# Patient Record
Sex: Female | Born: 1955 | Race: White | Hispanic: No | Smoking: Never smoker
Health system: Southern US, Community
[De-identification: ages and names within clinical notes are randomized; demographics above are authoritative.]

## PROBLEM LIST (undated history)

## (undated) DIAGNOSIS — Z800 Family history of malignant neoplasm of digestive organs: Secondary | ICD-10-CM

## (undated) DIAGNOSIS — I10 Essential (primary) hypertension: Secondary | ICD-10-CM

## (undated) DIAGNOSIS — Z803 Family history of malignant neoplasm of breast: Secondary | ICD-10-CM

## (undated) DIAGNOSIS — E119 Type 2 diabetes mellitus without complications: Secondary | ICD-10-CM

## (undated) DIAGNOSIS — Z923 Personal history of irradiation: Secondary | ICD-10-CM

## (undated) DIAGNOSIS — Z808 Family history of malignant neoplasm of other organs or systems: Secondary | ICD-10-CM

## (undated) HISTORY — DX: Family history of malignant neoplasm of breast: Z80.3

## (undated) HISTORY — PX: FRACTURE SURGERY: SHX138

## (undated) HISTORY — DX: Family history of malignant neoplasm of other organs or systems: Z80.8

## (undated) HISTORY — PX: COLONOSCOPY: SHX174

## (undated) HISTORY — PX: CHOLECYSTECTOMY: SHX55

## (undated) HISTORY — PX: OTHER SURGICAL HISTORY: SHX169

## (undated) HISTORY — DX: Family history of malignant neoplasm of digestive organs: Z80.0

---

## 2013-06-09 ENCOUNTER — Ambulatory Visit: Payer: Self-pay | Admitting: Emergency Medicine

## 2014-12-11 ENCOUNTER — Ambulatory Visit
Admit: 2014-12-11 | Disposition: A | Discharge status: Home / Self Care | Payer: Self-pay | Attending: Family Medicine | Admitting: Family Medicine

## 2016-01-01 DIAGNOSIS — M1712 Unilateral primary osteoarthritis, left knee: Secondary | ICD-10-CM | POA: Insufficient documentation

## 2016-03-10 ENCOUNTER — Other Ambulatory Visit: Payer: Self-pay | Admitting: Family Medicine

## 2016-03-10 DIAGNOSIS — Z1231 Encounter for screening mammogram for malignant neoplasm of breast: Secondary | ICD-10-CM

## 2016-03-17 ENCOUNTER — Encounter: Payer: Self-pay | Admitting: Radiology

## 2016-03-17 ENCOUNTER — Ambulatory Visit
Admission: RE | Admit: 2016-03-17 | Discharge: 2016-03-17 | Disposition: A | Discharge status: Home / Self Care | Payer: BC Managed Care – PPO | Source: Ambulatory Visit | Attending: Family Medicine | Admitting: Family Medicine

## 2016-03-17 DIAGNOSIS — Z1231 Encounter for screening mammogram for malignant neoplasm of breast: Secondary | ICD-10-CM | POA: Diagnosis present

## 2018-04-28 ENCOUNTER — Encounter (INDEPENDENT_AMBULATORY_CARE_PROVIDER_SITE_OTHER): Payer: Self-pay

## 2018-04-28 ENCOUNTER — Other Ambulatory Visit: Payer: Self-pay | Admitting: Obstetrics and Gynecology

## 2018-04-28 ENCOUNTER — Ambulatory Visit
Admission: RE | Admit: 2018-04-28 | Discharge: 2018-04-28 | Disposition: A | Discharge status: Home / Self Care | Payer: BC Managed Care – PPO | Source: Ambulatory Visit | Attending: Obstetrics and Gynecology | Admitting: Obstetrics and Gynecology

## 2018-04-28 DIAGNOSIS — Z1231 Encounter for screening mammogram for malignant neoplasm of breast: Secondary | ICD-10-CM | POA: Insufficient documentation

## 2018-05-03 ENCOUNTER — Other Ambulatory Visit: Payer: Self-pay | Admitting: Obstetrics and Gynecology

## 2018-05-03 DIAGNOSIS — N6489 Other specified disorders of breast: Secondary | ICD-10-CM

## 2018-05-03 DIAGNOSIS — R928 Other abnormal and inconclusive findings on diagnostic imaging of breast: Secondary | ICD-10-CM

## 2018-05-11 ENCOUNTER — Ambulatory Visit: Payer: BC Managed Care – PPO

## 2018-05-11 ENCOUNTER — Other Ambulatory Visit: Payer: BC Managed Care – PPO

## 2018-05-14 ENCOUNTER — Ambulatory Visit
Admission: RE | Admit: 2018-05-14 | Discharge: 2018-05-14 | Disposition: A | Discharge status: Home / Self Care | Payer: BC Managed Care – PPO | Source: Ambulatory Visit | Attending: Obstetrics and Gynecology | Admitting: Obstetrics and Gynecology

## 2018-05-14 DIAGNOSIS — R928 Other abnormal and inconclusive findings on diagnostic imaging of breast: Secondary | ICD-10-CM

## 2018-05-14 DIAGNOSIS — N6489 Other specified disorders of breast: Secondary | ICD-10-CM

## 2018-08-04 ENCOUNTER — Other Ambulatory Visit: Payer: Self-pay | Admitting: Obstetrics and Gynecology

## 2018-08-04 DIAGNOSIS — N6489 Other specified disorders of breast: Secondary | ICD-10-CM

## 2018-08-19 ENCOUNTER — Ambulatory Visit
Admission: RE | Admit: 2018-08-19 | Discharge: 2018-08-19 | Disposition: A | Discharge status: Home / Self Care | Payer: BC Managed Care – PPO | Source: Ambulatory Visit | Attending: Obstetrics and Gynecology | Admitting: Obstetrics and Gynecology

## 2018-08-19 DIAGNOSIS — N6489 Other specified disorders of breast: Secondary | ICD-10-CM

## 2018-08-24 ENCOUNTER — Other Ambulatory Visit: Payer: Self-pay | Admitting: Obstetrics and Gynecology

## 2018-08-24 DIAGNOSIS — N6489 Other specified disorders of breast: Secondary | ICD-10-CM

## 2019-06-23 ENCOUNTER — Ambulatory Visit
Admission: RE | Admit: 2019-06-23 | Discharge: 2019-06-23 | Disposition: A | Discharge status: Home / Self Care | Payer: BC Managed Care – PPO | Source: Ambulatory Visit | Attending: Obstetrics and Gynecology | Admitting: Obstetrics and Gynecology

## 2019-06-23 DIAGNOSIS — N6489 Other specified disorders of breast: Secondary | ICD-10-CM

## 2019-12-20 ENCOUNTER — Other Ambulatory Visit: Payer: Self-pay | Admitting: Surgery

## 2019-12-20 DIAGNOSIS — M1712 Unilateral primary osteoarthritis, left knee: Secondary | ICD-10-CM

## 2019-12-20 DIAGNOSIS — M23204 Derangement of unspecified medial meniscus due to old tear or injury, left knee: Secondary | ICD-10-CM

## 2019-12-26 ENCOUNTER — Other Ambulatory Visit: Payer: Self-pay

## 2019-12-26 ENCOUNTER — Ambulatory Visit
Admission: RE | Admit: 2019-12-26 | Discharge: 2019-12-26 | Disposition: A | Discharge status: Home / Self Care | Payer: BC Managed Care – PPO | Source: Ambulatory Visit | Attending: Surgery | Admitting: Surgery

## 2019-12-26 DIAGNOSIS — M1712 Unilateral primary osteoarthritis, left knee: Secondary | ICD-10-CM | POA: Insufficient documentation

## 2019-12-26 DIAGNOSIS — M23204 Derangement of unspecified medial meniscus due to old tear or injury, left knee: Secondary | ICD-10-CM | POA: Diagnosis not present

## 2020-01-06 ENCOUNTER — Other Ambulatory Visit: Payer: Self-pay | Admitting: Surgery

## 2020-01-13 ENCOUNTER — Other Ambulatory Visit: Payer: Self-pay

## 2020-01-13 ENCOUNTER — Encounter
Admission: RE | Admit: 2020-01-13 | Discharge: 2020-01-13 | Disposition: A | Discharge status: Home / Self Care | Payer: BC Managed Care – PPO | Source: Ambulatory Visit | Attending: Surgery | Admitting: Surgery

## 2020-01-13 HISTORY — DX: Essential (primary) hypertension: I10

## 2020-01-13 NOTE — Patient Instructions (Signed)
Your pre-op EKG visit is: Tuesday 01/17/2020 @ 1:00 pm.  [facility] and follow directions to Pre-Admit Testing department. Your pre-op COVID test is: Tuesday 01/24/2020.  Drive up in front of [place] any time 8:00 am to 1:00 pm.  Your procedure is scheduled on: Thursday 01/26/2020 Report [facility], take elevator to 2nd floor and check in with Surgery Information.) To find out your arrival time, call [phone] 1:00-3:00 PM on Wednesday 01/25/2020  Remember: Instructions that are not followed completely may result in serious medical risk, up to and including death, or upon the discretion of your surgeon and anesthesiologist your surgery may need to be rescheduled.   __x__ 1. Do not eat food (including mints, candies, chewing gum) after midnight the night before your procedure. You may drink clear liquids up to 2 hours before you are scheduled to arrive at the hospital for your procedure.  Do not drink anything within 2 hours of your scheduled arrival to the hospital.  Approved clear liquids:  --Water or Apple juice without pulp  --Clear carbohydrate beverage such as Gatorade or Powerade  --Black Coffee or Clear Tea (No milk, no creamers, do not add anything to the coffee or tea)   __x__ 2. Finish your Ensure drink (enclosed) 2 hours before your arrival time on the day of surgery.  __x__ 3. No alcohol or smoking for 24 hours before or after surgery.  __x__ 4. Notify your doctor if there is any change in your medical condition (cold, fever, infections).  __x__ 5. On the morning of surgery brush your teeth with toothpaste and water.  You may rinse your mouth with mouthwash if you wish.  Do not swallow any toothpaste or mouthwash.  Please read over the attached fact sheets:   [facility] Preparing for Surgery, MRSA Information, Guide to Total Joint Replacement  __x__ Use CHG Soap as directed on instruction sheet.   Do not wear jewelry, make-up,  hairpins, clips or nail polish on the day of surgery.  Do not wear lotions, powders, deodorant, or perfumes.   Do not shave below the face/neck 48 hours prior to surgery.   Do not bring valuables to the hospital.  [facility] is not responsible for any belongings or valuables.   Contact lenses, dentures may not be worn into surgery.  It is helpful to bring a case for these items that can be labeled during your hospital stay. For patients admitted to the hospital, discharge date/ time is determined by your treatment team.   __x__ Take these medicines on the morning of surgery with a SMALL SIP OF WATER:  1. NONE  _N/A_ Follow recommendations from Cardiologist, Pulmonologist or PCP regarding stopping Coumadin, Plavix, Eliquis, Effient, Pradaxa, and Pletal.  __x__ 7 DAYS BEFORE SURGERY: Stop Anti-inflammatories such as Etodolac (Lodine), Advil, Ibuprofen, Motrin, Aleve, Naproxen, Naprosyn, BC/Goodies powders or aspirin products. You may take Tylenol if needed.   __x__ 7 DAYS BEFORE SURGERY: Do not take any over the counter supplements (Calcium multi-supplement) until after surgery.

## 2020-01-17 ENCOUNTER — Other Ambulatory Visit: Payer: Self-pay

## 2020-01-17 ENCOUNTER — Encounter
Admission: RE | Admit: 2020-01-17 | Discharge: 2020-01-17 | Disposition: A | Discharge status: Home / Self Care | Payer: BC Managed Care – PPO | Source: Ambulatory Visit | Attending: Surgery | Admitting: Surgery

## 2020-01-17 DIAGNOSIS — Z01818 Encounter for other preprocedural examination: Secondary | ICD-10-CM | POA: Insufficient documentation

## 2020-01-17 LAB — CBC WITH DIFFERENTIAL/PLATELET
Abs Immature Granulocytes: 0.03 10*3/uL (ref 0.00–0.07)
Basophils Absolute: 0.1 10*3/uL (ref 0.0–0.1)
Basophils Relative: 1 %
Eosinophils Absolute: 0.2 10*3/uL (ref 0.0–0.5)
Eosinophils Relative: 3 %
HCT: 40.8 % (ref 36.0–46.0)
Hemoglobin: 13.8 g/dL (ref 12.0–15.0)
Immature Granulocytes: 0 %
Lymphocytes Relative: 24 %
Lymphs Abs: 2.1 10*3/uL (ref 0.7–4.0)
MCH: 30.3 pg (ref 26.0–34.0)
MCHC: 33.8 g/dL (ref 30.0–36.0)
MCV: 89.5 fL (ref 80.0–100.0)
Monocytes Absolute: 0.7 10*3/uL (ref 0.1–1.0)
Monocytes Relative: 9 %
Neutro Abs: 5.4 10*3/uL (ref 1.7–7.7)
Neutrophils Relative %: 63 %
Platelets: 220 10*3/uL (ref 150–400)
RBC: 4.56 MIL/uL (ref 3.87–5.11)
RDW: 13.2 % (ref 11.5–15.5)
WBC: 8.5 10*3/uL (ref 4.0–10.5)
nRBC: 0 % (ref 0.0–0.2)

## 2020-01-17 LAB — COMPREHENSIVE METABOLIC PANEL
ALT: 28 U/L (ref 0–44)
AST: 26 U/L (ref 15–41)
Albumin: 3.9 g/dL (ref 3.5–5.0)
Alkaline Phosphatase: 68 U/L (ref 38–126)
Anion gap: 7 (ref 5–15)
BUN: 18 mg/dL (ref 8–23)
CO2: 27 mmol/L (ref 22–32)
Calcium: 9.5 mg/dL (ref 8.9–10.3)
Chloride: 107 mmol/L (ref 98–111)
Creatinine, Ser: 0.63 mg/dL (ref 0.44–1.00)
GFR calc Af Amer: >60 mL/min (ref >60)
GFR calc non Af Amer: >60 mL/min (ref >60)
Glucose, Bld: 95 mg/dL (ref 70–99)
Potassium: 4.3 mmol/L (ref 3.5–5.1)
Sodium: 141 mmol/L (ref 135–145)
Total Bilirubin: 1.1 mg/dL (ref 0.3–1.2)
Total Protein: 7.4 g/dL (ref 6.5–8.1)

## 2020-01-17 LAB — TYPE AND SCREEN
ABO/RH(D): O POS
Antibody Screen: NEGATIVE

## 2020-01-17 LAB — URINALYSIS, ROUTINE W REFLEX MICROSCOPIC
Glucose, UA: NEGATIVE mg/dL
Hgb urine dipstick: NEGATIVE
Ketones, ur: NEGATIVE mg/dL
Leukocytes,Ua: NEGATIVE
Nitrite: NEGATIVE
Protein, ur: NEGATIVE mg/dL
Specific Gravity, Urine: 1.025 (ref 1.005–1.030)
pH: 6 (ref 5.0–8.0)

## 2020-01-17 LAB — SURGICAL PCR SCREEN
MRSA, PCR: NEGATIVE
Staphylococcus aureus: POSITIVE — AB

## 2020-01-18 NOTE — Pre-Procedure Instructions (Signed)
Pre-Admit Testing Provider Notification Note  Provider Notified: Dr. Poggi  Notification Mode: Fax  Reason: Abnormal lab result.  Response: Fax confirmation received.   Additional Information: Placed on chart. Noted on Pre-Admit Worksheet.  Signed: Danilynn Jemison, RN  

## 2020-01-24 ENCOUNTER — Other Ambulatory Visit: Payer: Self-pay

## 2020-01-24 ENCOUNTER — Other Ambulatory Visit
Admission: RE | Admit: 2020-01-24 | Discharge: 2020-01-24 | Disposition: A | Discharge status: Home / Self Care | Payer: BC Managed Care – PPO | Source: Ambulatory Visit | Attending: Surgery | Admitting: Surgery

## 2020-01-24 DIAGNOSIS — Z20822 Contact with and (suspected) exposure to covid-19: Secondary | ICD-10-CM | POA: Insufficient documentation

## 2020-01-24 DIAGNOSIS — Z01812 Encounter for preprocedural laboratory examination: Secondary | ICD-10-CM | POA: Insufficient documentation

## 2020-01-25 LAB — SARS CORONAVIRUS 2 (TAT 6-24 HRS): SARS Coronavirus 2: NEGATIVE

## 2020-01-26 ENCOUNTER — Inpatient Hospital Stay: Payer: BC Managed Care – PPO | Admitting: Certified Registered"

## 2020-01-26 ENCOUNTER — Inpatient Hospital Stay
Admission: AD | Admit: 2020-01-26 | Discharge: 2020-01-29 | DRG: 470 | Disposition: A | Discharge status: Home Health | Payer: BC Managed Care – PPO | Attending: Surgery | Admitting: Surgery

## 2020-01-26 ENCOUNTER — Other Ambulatory Visit: Payer: Self-pay

## 2020-01-26 ENCOUNTER — Encounter
Admission: AD | Disposition: A | Discharge status: Home / Self Care | Payer: Self-pay | Source: Home / Self Care | Attending: Surgery

## 2020-01-26 ENCOUNTER — Inpatient Hospital Stay: Payer: BC Managed Care – PPO

## 2020-01-26 ENCOUNTER — Encounter: Payer: Self-pay | Admitting: Surgery

## 2020-01-26 DIAGNOSIS — Z801 Family history of malignant neoplasm of trachea, bronchus and lung: Secondary | ICD-10-CM | POA: Diagnosis not present

## 2020-01-26 DIAGNOSIS — Z79899 Other long term (current) drug therapy: Secondary | ICD-10-CM

## 2020-01-26 DIAGNOSIS — M7122 Synovial cyst of popliteal space [Baker], left knee: Secondary | ICD-10-CM | POA: Diagnosis present

## 2020-01-26 DIAGNOSIS — M25362 Other instability, left knee: Secondary | ICD-10-CM | POA: Diagnosis present

## 2020-01-26 DIAGNOSIS — K219 Gastro-esophageal reflux disease without esophagitis: Secondary | ICD-10-CM | POA: Diagnosis present

## 2020-01-26 DIAGNOSIS — Z6841 Body Mass Index (BMI) 40.0 and over, adult: Secondary | ICD-10-CM | POA: Diagnosis not present

## 2020-01-26 DIAGNOSIS — Z96652 Presence of left artificial knee joint: Secondary | ICD-10-CM

## 2020-01-26 DIAGNOSIS — Z9049 Acquired absence of other specified parts of digestive tract: Secondary | ICD-10-CM | POA: Diagnosis not present

## 2020-01-26 DIAGNOSIS — I10 Essential (primary) hypertension: Secondary | ICD-10-CM | POA: Diagnosis present

## 2020-01-26 DIAGNOSIS — Z20822 Contact with and (suspected) exposure to covid-19: Secondary | ICD-10-CM | POA: Diagnosis present

## 2020-01-26 DIAGNOSIS — G4730 Sleep apnea, unspecified: Secondary | ICD-10-CM | POA: Diagnosis present

## 2020-01-26 DIAGNOSIS — Z8249 Family history of ischemic heart disease and other diseases of the circulatory system: Secondary | ICD-10-CM

## 2020-01-26 DIAGNOSIS — M1712 Unilateral primary osteoarthritis, left knee: Principal | ICD-10-CM | POA: Diagnosis present

## 2020-01-26 DIAGNOSIS — Z8349 Family history of other endocrine, nutritional and metabolic diseases: Secondary | ICD-10-CM | POA: Diagnosis not present

## 2020-01-26 HISTORY — PX: TOTAL KNEE ARTHROPLASTY: SHX125

## 2020-01-26 LAB — ABO/RH: ABO/RH(D): O POS

## 2020-01-26 SURGERY — ARTHROPLASTY, KNEE, TOTAL
Anesthesia: Spinal | Site: Knee | Laterality: Left

## 2020-01-26 MED ORDER — SODIUM CHLORIDE 0.9 % IV SOLN
INTRAVENOUS | Status: DC | PRN
  Administered 2020-01-26: 25 ug/min via INTRAVENOUS

## 2020-01-26 MED ORDER — ONDANSETRON HCL 4 MG/2ML IJ SOLN
INTRAMUSCULAR | Status: AC
  Filled 2020-01-26: qty 2

## 2020-01-26 MED ORDER — BISACODYL 10 MG RE SUPP: 10.0000 mg | Freq: Every day | RECTAL | Status: DC | PRN

## 2020-01-26 MED ORDER — CHLORHEXIDINE GLUCONATE 0.12 % MT SOLN
OROMUCOSAL | Status: AC
  Filled 2020-01-26: qty 15

## 2020-01-26 MED ORDER — KETOROLAC TROMETHAMINE 30 MG/ML IJ SOLN: 30.0000 mg | Freq: Once | INTRAMUSCULAR | Status: AC

## 2020-01-26 MED ORDER — GLYCOPYRROLATE 0.2 MG/ML IJ SOLN
INTRAMUSCULAR | Status: DC | PRN
  Administered 2020-01-26: .2 mg via INTRAVENOUS

## 2020-01-26 MED ORDER — LACTATED RINGERS IV SOLN
INTRAVENOUS | Status: DC
  Administered 2020-01-26: 75 mL/h via INTRAVENOUS

## 2020-01-26 MED ORDER — OXYCODONE HCL 5 MG PO TABS
5.0000 mg | ORAL_TABLET | ORAL | Status: DC | PRN
  Administered 2020-01-26 – 2020-01-27 (×5): 5 mg via ORAL
  Administered 2020-01-28 – 2020-01-29 (×7): 10 mg via ORAL
  Filled 2020-01-26: qty 1
  Filled 2020-01-26 (×3): qty 2
  Filled 2020-01-26: qty 1
  Filled 2020-01-26 (×6): qty 2
  Filled 2020-01-26 (×2): qty 1
  Filled 2020-01-26: qty 2

## 2020-01-26 MED ORDER — ACETAMINOPHEN 500 MG PO TABS
1000.0000 mg | ORAL_TABLET | Freq: Four times a day (QID) | ORAL | Status: AC
  Administered 2020-01-26 – 2020-01-27 (×3): 1000 mg via ORAL
  Filled 2020-01-26 (×3): qty 2

## 2020-01-26 MED ORDER — FAMOTIDINE 20 MG PO TABS
ORAL_TABLET | ORAL | Status: AC
  Administered 2020-01-26: 20 mg via ORAL
  Filled 2020-01-26: qty 1

## 2020-01-26 MED ORDER — ONDANSETRON HCL 4 MG/2ML IJ SOLN: 4.0000 mg | Freq: Four times a day (QID) | INTRAMUSCULAR | Status: DC | PRN

## 2020-01-26 MED ORDER — FENTANYL CITRATE (PF) 100 MCG/2ML IJ SOLN
INTRAMUSCULAR | Status: AC
  Administered 2020-01-26: 50 ug via INTRAVENOUS
  Filled 2020-01-26: qty 2

## 2020-01-26 MED ORDER — FAMOTIDINE 20 MG PO TABS: 20.0000 mg | ORAL_TABLET | Freq: Once | ORAL | Status: AC

## 2020-01-26 MED ORDER — LOSARTAN POTASSIUM 25 MG PO TABS
25.0000 mg | ORAL_TABLET | Freq: Every day | ORAL | Status: DC
  Administered 2020-01-26 – 2020-01-29 (×4): 25 mg via ORAL
  Filled 2020-01-26 (×4): qty 1

## 2020-01-26 MED ORDER — MAGNESIUM HYDROXIDE 400 MG/5ML PO SUSP
30.0000 mL | Freq: Every day | ORAL | Status: DC | PRN
  Administered 2020-01-27: 30 mL via ORAL
  Filled 2020-01-26: qty 30

## 2020-01-26 MED ORDER — SODIUM CHLORIDE 0.9 % IV SOLN: INTRAVENOUS | Status: DC

## 2020-01-26 MED ORDER — METOCLOPRAMIDE HCL 5 MG/ML IJ SOLN: 5.0000 mg | Freq: Three times a day (TID) | INTRAMUSCULAR | Status: DC | PRN

## 2020-01-26 MED ORDER — MIDAZOLAM HCL 2 MG/2ML IJ SOLN
INTRAMUSCULAR | Status: AC
  Filled 2020-01-26: qty 2

## 2020-01-26 MED ORDER — KETAMINE HCL 10 MG/ML IJ SOLN
INTRAMUSCULAR | Status: DC | PRN
  Administered 2020-01-26 (×5): 10 mg via INTRAVENOUS

## 2020-01-26 MED ORDER — ONDANSETRON HCL 4 MG/2ML IJ SOLN
INTRAMUSCULAR | Status: DC | PRN
  Administered 2020-01-26: 4 mg via INTRAVENOUS

## 2020-01-26 MED ORDER — KETOROLAC TROMETHAMINE 30 MG/ML IJ SOLN
INTRAMUSCULAR | Status: AC
  Administered 2020-01-26: 30 mg via INTRAVENOUS
  Filled 2020-01-26: qty 1

## 2020-01-26 MED ORDER — BUPIVACAINE HCL (PF) 0.5 % IJ SOLN
INTRAMUSCULAR | Status: DC | PRN
  Administered 2020-01-26: 2.6 mL

## 2020-01-26 MED ORDER — TRANEXAMIC ACID 1000 MG/10ML IV SOLN
INTRAVENOUS | Status: AC
  Filled 2020-01-26: qty 10

## 2020-01-26 MED ORDER — HYDROMORPHONE HCL 1 MG/ML IJ SOLN
INTRAMUSCULAR | Status: AC
  Filled 2020-01-26: qty 1

## 2020-01-26 MED ORDER — OXYCODONE HCL 5 MG PO TABS
ORAL_TABLET | ORAL | Status: AC
  Administered 2020-01-26: 5 mg via ORAL
  Filled 2020-01-26: qty 1

## 2020-01-26 MED ORDER — CHLORHEXIDINE GLUCONATE 0.12 % MT SOLN
15.0000 mL | Freq: Once | OROMUCOSAL | Status: AC
  Administered 2020-01-26: 15 mL via OROMUCOSAL

## 2020-01-26 MED ORDER — SPIRONOLACTONE 25 MG PO TABS
25.0000 mg | ORAL_TABLET | Freq: Every day | ORAL | Status: DC
  Administered 2020-01-26 – 2020-01-29 (×4): 25 mg via ORAL
  Filled 2020-01-26 (×4): qty 1

## 2020-01-26 MED ORDER — BUPIVACAINE-EPINEPHRINE (PF) 0.5% -1:200000 IJ SOLN
INTRAMUSCULAR | Status: DC | PRN
  Administered 2020-01-26: 30 mL

## 2020-01-26 MED ORDER — BUPIVACAINE-EPINEPHRINE (PF) 0.5% -1:200000 IJ SOLN
INTRAMUSCULAR | Status: AC
  Filled 2020-01-26: qty 90

## 2020-01-26 MED ORDER — FENTANYL CITRATE (PF) 100 MCG/2ML IJ SOLN
INTRAMUSCULAR | Status: DC | PRN
  Administered 2020-01-26: 25 ug via INTRAVENOUS
  Administered 2020-01-26: 50 ug via INTRAVENOUS
  Administered 2020-01-26: 25 ug via INTRAVENOUS

## 2020-01-26 MED ORDER — DEXMEDETOMIDINE HCL IN NACL 80 MCG/20ML IV SOLN
INTRAVENOUS | Status: AC
  Filled 2020-01-26: qty 20

## 2020-01-26 MED ORDER — DOCUSATE SODIUM 100 MG PO CAPS
100.0000 mg | ORAL_CAPSULE | Freq: Two times a day (BID) | ORAL | Status: DC
  Administered 2020-01-26 – 2020-01-29 (×6): 100 mg via ORAL
  Filled 2020-01-26 (×6): qty 1

## 2020-01-26 MED ORDER — SODIUM CHLORIDE FLUSH 0.9 % IV SOLN
INTRAVENOUS | Status: AC
  Filled 2020-01-26: qty 20

## 2020-01-26 MED ORDER — HYDROMORPHONE HCL 1 MG/ML IJ SOLN
0.2500 mg | INTRAMUSCULAR | Status: DC | PRN
  Administered 2020-01-28 (×3): 0.5 mg via INTRAVENOUS
  Filled 2020-01-26 (×3): qty 1

## 2020-01-26 MED ORDER — ORAL CARE MOUTH RINSE: 15.0000 mL | Freq: Once | OROMUCOSAL | Status: AC

## 2020-01-26 MED ORDER — DIPHENHYDRAMINE HCL 12.5 MG/5ML PO ELIX: 12.5000 mg | ORAL_SOLUTION | ORAL | Status: DC | PRN

## 2020-01-26 MED ORDER — KETOROLAC TROMETHAMINE 15 MG/ML IJ SOLN
15.0000 mg | Freq: Four times a day (QID) | INTRAMUSCULAR | Status: AC
  Administered 2020-01-26 – 2020-01-27 (×4): 15 mg via INTRAVENOUS
  Filled 2020-01-26: qty 1

## 2020-01-26 MED ORDER — KETAMINE HCL 50 MG/ML IJ SOLN
INTRAMUSCULAR | Status: AC
  Filled 2020-01-26: qty 10

## 2020-01-26 MED ORDER — OXYCODONE HCL 5 MG PO TABS
ORAL_TABLET | ORAL | Status: AC
  Filled 2020-01-26: qty 1

## 2020-01-26 MED ORDER — PROMETHAZINE HCL 25 MG/ML IJ SOLN: 6.2500 mg | INTRAMUSCULAR | Status: DC | PRN

## 2020-01-26 MED ORDER — ENOXAPARIN SODIUM 40 MG/0.4ML ~~LOC~~ SOLN
40.0000 mg | Freq: Two times a day (BID) | SUBCUTANEOUS | Status: DC
  Administered 2020-01-27 – 2020-01-29 (×5): 40 mg via SUBCUTANEOUS
  Filled 2020-01-26 (×5): qty 0.4

## 2020-01-26 MED ORDER — DEXTROSE 5 % IV SOLN
3.0000 g | Freq: Four times a day (QID) | INTRAVENOUS | Status: AC
  Administered 2020-01-26 – 2020-01-27 (×3): 3 g via INTRAVENOUS
  Filled 2020-01-26 (×3): qty 3

## 2020-01-26 MED ORDER — ACETAMINOPHEN 325 MG PO TABS
325.0000 mg | ORAL_TABLET | Freq: Four times a day (QID) | ORAL | Status: DC | PRN
  Administered 2020-01-28 (×2): 650 mg via ORAL
  Filled 2020-01-26 (×2): qty 2

## 2020-01-26 MED ORDER — ONDANSETRON HCL 4 MG PO TABS: 4.0000 mg | ORAL_TABLET | Freq: Four times a day (QID) | ORAL | Status: DC | PRN

## 2020-01-26 MED ORDER — BUPIVACAINE LIPOSOME 1.3 % IJ SUSP
INTRAMUSCULAR | Status: AC
  Filled 2020-01-26: qty 20

## 2020-01-26 MED ORDER — PROPOFOL 500 MG/50ML IV EMUL
INTRAVENOUS | Status: AC
  Filled 2020-01-26: qty 50

## 2020-01-26 MED ORDER — TRANEXAMIC ACID 1000 MG/10ML IV SOLN
INTRAVENOUS | Status: DC | PRN
  Administered 2020-01-26: 1000 mg via TOPICAL

## 2020-01-26 MED ORDER — TRAMADOL HCL 50 MG PO TABS
50.0000 mg | ORAL_TABLET | Freq: Four times a day (QID) | ORAL | Status: DC
  Administered 2020-01-26 – 2020-01-29 (×12): 50 mg via ORAL
  Filled 2020-01-26 (×11): qty 1

## 2020-01-26 MED ORDER — MIDAZOLAM HCL 5 MG/5ML IJ SOLN
INTRAMUSCULAR | Status: DC | PRN
  Administered 2020-01-26 (×4): 1 mg via INTRAVENOUS

## 2020-01-26 MED ORDER — ADULT MULTIVITAMIN W/MINERALS CH
1.0000 | ORAL_TABLET | Freq: Two times a day (BID) | ORAL | Status: DC
  Administered 2020-01-26 – 2020-01-29 (×6): 1 via ORAL
  Filled 2020-01-26 (×6): qty 1

## 2020-01-26 MED ORDER — FENTANYL CITRATE (PF) 100 MCG/2ML IJ SOLN
INTRAMUSCULAR | Status: AC
  Filled 2020-01-26: qty 2

## 2020-01-26 MED ORDER — DEXMEDETOMIDINE HCL 200 MCG/2ML IV SOLN
INTRAVENOUS | Status: DC | PRN
  Administered 2020-01-26 (×3): 4 ug via INTRAVENOUS

## 2020-01-26 MED ORDER — SODIUM CHLORIDE 0.9 % IV SOLN
INTRAVENOUS | Status: DC | PRN
  Administered 2020-01-26: 60 mL

## 2020-01-26 MED ORDER — DEXTROSE 5 % IV SOLN
3.0000 g | INTRAVENOUS | Status: AC
  Administered 2020-01-26: 3 g via INTRAVENOUS
  Filled 2020-01-26: qty 3

## 2020-01-26 MED ORDER — KETOROLAC TROMETHAMINE 15 MG/ML IJ SOLN
INTRAMUSCULAR | Status: AC
  Filled 2020-01-26: qty 1

## 2020-01-26 MED ORDER — HYDROMORPHONE HCL 1 MG/ML IJ SOLN
INTRAMUSCULAR | Status: DC | PRN
  Administered 2020-01-26 (×4): .25 mg via INTRAVENOUS

## 2020-01-26 MED ORDER — LIDOCAINE HCL (PF) 2 % IJ SOLN
INTRAMUSCULAR | Status: AC
  Filled 2020-01-26: qty 5

## 2020-01-26 MED ORDER — FENTANYL CITRATE (PF) 100 MCG/2ML IJ SOLN
25.0000 ug | INTRAMUSCULAR | Status: DC | PRN
  Administered 2020-01-26: 50 ug via INTRAVENOUS

## 2020-01-26 MED ORDER — METOCLOPRAMIDE HCL 10 MG PO TABS: 5.0000 mg | ORAL_TABLET | Freq: Three times a day (TID) | ORAL | Status: DC | PRN

## 2020-01-26 MED ORDER — PROPOFOL 500 MG/50ML IV EMUL
INTRAVENOUS | Status: DC | PRN
  Administered 2020-01-26: 75 ug/kg/min via INTRAVENOUS

## 2020-01-26 MED ORDER — FLEET ENEMA 7-19 GM/118ML RE ENEM: 1.0000 | ENEMA | Freq: Once | RECTAL | Status: DC | PRN

## 2020-01-26 MED ORDER — BUPIVACAINE HCL (PF) 0.5 % IJ SOLN
INTRAMUSCULAR | Status: AC
  Filled 2020-01-26: qty 10

## 2020-01-26 MED ORDER — TRAMADOL HCL 50 MG PO TABS
ORAL_TABLET | ORAL | Status: AC
  Filled 2020-01-26: qty 1

## 2020-01-26 SURGICAL SUPPLY — 62 items
BLADE SAW SAG 25X90X1.19 (BLADE) ×3 IMPLANT
BLADE SURG SZ20 CARB STEEL (BLADE) ×3 IMPLANT
BNDG ELASTIC 6X5.8 VLCR NS LF (GAUZE/BANDAGES/DRESSINGS) ×3 IMPLANT
CANISTER SUCT 1200ML W/VALVE (MISCELLANEOUS) ×3 IMPLANT
CANISTER SUCT 3000ML PPV (MISCELLANEOUS) ×3 IMPLANT
CEMENT BONE R 1X40 (Cement) ×6 IMPLANT
CEMENT VACUUM MIXING SYSTEM (MISCELLANEOUS) ×3 IMPLANT
CHLORAPREP W/TINT 26 (MISCELLANEOUS) ×3 IMPLANT
COOLER POLAR GLACIER W/PUMP (MISCELLANEOUS) ×3 IMPLANT
COVER MAYO STAND REUSABLE (DRAPES) ×3 IMPLANT
COVER WAND RF STERILE (DRAPES) ×3 IMPLANT
CUFF TOURN SGL QUICK 24 (TOURNIQUET CUFF)
CUFF TOURN SGL QUICK 30 (TOURNIQUET CUFF)
CUFF TOURN SGL QUICK 34 (TOURNIQUET CUFF) ×2
CUFF TRNQT CYL 24X4X16.5-23 (TOURNIQUET CUFF) IMPLANT
CUFF TRNQT CYL 30X4X21-28X (TOURNIQUET CUFF) IMPLANT
CUFF TRNQT CYL 34X4.125X (TOURNIQUET CUFF) IMPLANT
DRAPE 3/4 80X56 (DRAPES) ×3 IMPLANT
DRAPE IMP U-DRAPE 54X76 (DRAPES) ×3 IMPLANT
DRSG OPSITE POSTOP 4X10 (GAUZE/BANDAGES/DRESSINGS) ×1 IMPLANT
DRSG OPSITE POSTOP 4X8 (GAUZE/BANDAGES/DRESSINGS) ×3 IMPLANT
ELECT CAUTERY BLADE 6.4 (BLADE) ×3 IMPLANT
ELECT REM PT RETURN 9FT ADLT (ELECTROSURGICAL) ×3
ELECTRODE REM PT RTRN 9FT ADLT (ELECTROSURGICAL) ×1 IMPLANT
FEMORAL CR LEFT 65MM (Joint) ×2 IMPLANT
GLOVE BIO SURGEON STRL SZ7.5 (GLOVE) ×12 IMPLANT
GLOVE BIO SURGEON STRL SZ8 (GLOVE) ×12 IMPLANT
GLOVE BIOGEL PI IND STRL 8 (GLOVE) ×1 IMPLANT
GLOVE BIOGEL PI INDICATOR 8 (GLOVE) ×2
GLOVE INDICATOR 8.0 STRL GRN (GLOVE) ×3 IMPLANT
GOWN STRL REUS W/ TWL LRG LVL3 (GOWN DISPOSABLE) ×1 IMPLANT
GOWN STRL REUS W/ TWL XL LVL3 (GOWN DISPOSABLE) ×1 IMPLANT
GOWN STRL REUS W/TWL LRG LVL3 (GOWN DISPOSABLE) ×2
GOWN STRL REUS W/TWL XL LVL3 (GOWN DISPOSABLE) ×2
HOLDER FOLEY CATH W/STRAP (MISCELLANEOUS) IMPLANT
HOOD PEEL AWAY FLYTE STAYCOOL (MISCELLANEOUS) ×11 IMPLANT
INSERT TIB BEARING 71 10 (Insert) ×2 IMPLANT
KIT TURNOVER KIT A (KITS) ×3 IMPLANT
NDL SAFETY ECLIPSE 18X1.5 (NEEDLE) ×2 IMPLANT
NDL SPNL 20GX3.5 QUINCKE YW (NEEDLE) ×1 IMPLANT
NEEDLE HYPO 18GX1.5 SHARP (NEEDLE) ×4
NEEDLE SPNL 20GX3.5 QUINCKE YW (NEEDLE) ×3 IMPLANT
NS IRRIG 1000ML POUR BTL (IV SOLUTION) ×3 IMPLANT
PACK TOTAL KNEE (MISCELLANEOUS) ×3 IMPLANT
PAD WRAPON POLAR KNEE (MISCELLANEOUS) ×1 IMPLANT
PATELLA STD 34X8.5 (Orthopedic Implant) ×2 IMPLANT
PLATE KNEE TIBIAL 71MM FIXED (Plate) ×2 IMPLANT
PULSAVAC PLUS IRRIG FAN TIP (DISPOSABLE) ×3
SOL .9 NS 3000ML IRR  AL (IV SOLUTION) ×2
SOL .9 NS 3000ML IRR UROMATIC (IV SOLUTION) ×1 IMPLANT
STAPLER SKIN PROX 35W (STAPLE) ×3 IMPLANT
SUCTION FRAZIER HANDLE 10FR (MISCELLANEOUS) ×2
SUCTION TUBE FRAZIER 10FR DISP (MISCELLANEOUS) ×1 IMPLANT
SUT VIC AB 0 CT1 36 (SUTURE) ×9 IMPLANT
SUT VIC AB 2-0 CT1 27 (SUTURE) ×6
SUT VIC AB 2-0 CT1 TAPERPNT 27 (SUTURE) ×3 IMPLANT
SYR 10ML LL (SYRINGE) ×3 IMPLANT
SYR 20ML LL LF (SYRINGE) ×3 IMPLANT
SYR 30ML LL (SYRINGE) ×9 IMPLANT
TIP FAN IRRIG PULSAVAC PLUS (DISPOSABLE) ×1 IMPLANT
TRAY FOLEY MTR SLVR 16FR STAT (SET/KITS/TRAYS/PACK) IMPLANT
WRAPON POLAR PAD KNEE (MISCELLANEOUS) ×3

## 2020-01-26 NOTE — Transfer of Care (Signed)
Immediate Anesthesia Transfer of Care Note  Patient: Holly Henderson  Procedure(s) Performed: TOTAL KNEE ARTHROPLASTY (Left Knee)  Patient Location: PACU  Anesthesia Type:Spinal  Level of Consciousness: awake, alert  and oriented  Airway & Oxygen Therapy: Patient Spontanous Breathing and Patient connected to face mask oxygen  Post-op Assessment: Report given to RN and Post -op Vital signs reviewed and stable  Post vital signs: Reviewed  Last Vitals:  Vitals Value Taken Time  BP 97/59 01/26/20 1304  Temp 36.3 C 01/26/20 1304  Pulse 76 01/26/20 1304  Resp 15 01/26/20 1304  SpO2 100 % 01/26/20 1304    Last Pain:  Vitals:   01/26/20 0850  TempSrc: Tympanic  PainSc: 5          Complications: No apparent anesthesia complications

## 2020-01-26 NOTE — H&P (Signed)
History of Present Illness: Holly Henderson is a 64 y.o.female who is here for history and physical for her left knee. The patient is being scheduled for a left total knee arthroplasty to be done by Dr. Roland Rack on January 26, 2020. The patient has continued to have left knee pain with continued symptoms that began about 5 years ago and developed without any specific cause or injury. The patient was treated over at [facility] in 2017. She received both steroid injections as well as viscosupplementation injections which provided only temporary partial relief of her symptoms. Due to worsening symptoms, she saw her primary care provider and was referred to [facility] for further evaluation and treatment. She saw Reche Dixon who sent her for an MRI scan and referred her to me for further evaluation and treatment. She reports 3/10 pain on today's visit. However, she feels as though her knee wants to give out and "go backwards" when she is ambulating. The pain is located along the anterior, lateral and medial aspects of the knee. The pain is described as aching, stabbing and throbbing. The symptoms are aggravated with normal daily activities, using stairs, at higher levels of activity, rising from a chair, walking and standing. She also describes mechanical symptoms. She has associated mild swelling and no deformity. She has tried acetaminophen, anti-inflammatories and steroid injections with limited benefit.  Current Outpatient Medications:  [id number]-D3-[id number]-CU-MANGAN ORAL Take by mouth   etodolac (LODINE) 500 MG tablet Take 1 tablet (500 mg total) by mouth 2 (two) times daily 60 tablet 11   losartan (COZAAR) 25 MG tablet Take 1 tablet (25 mg total) by mouth once daily 30 tablet 11   spironolactone (ALDACTONE) 25 MG tablet Take 1 tablet (25 mg total) by mouth once daily 30 tablet 11   No current Epic-ordered facility-administered medications on file.   No Known Allergies  Past Medical History:  Diagnosis  Date   Abnormal Pap smear   GERD (gastroesophageal reflux disease)   Hypertension   Primary osteoarthritis of one knee, left 01/01/2016   Sleep apnea   Past Surgical History:   CHOLECYSTECTOMY   COLONOSCOPY 06/25/2018  Normal Colon/Repeat 30yrs/TKT   ENDOMETRIAL ABLATION   EXTRACTION TEETH  had 12 teeth pulled at the same time.   FRACTURE SURGERY 1970   wisdom teeth   Family History:   High blood pressure (Hypertension) Mother   Alcohol abuse Mother   High blood pressure (Hypertension) Father   Hyperlipidemia (Elevated cholesterol) Father   Gout Father   Coronary Artery Disease (Blocked arteries around heart) Father  CABG   Glaucoma Maternal Grandmother   Lung cancer Maternal Grandmother   Stroke Maternal Grandmother   Aortic aneurysm Maternal Grandfather   Gout Maternal Grandfather   Coronary Artery Disease (Blocked arteries around heart) Maternal Grandfather  CABG   Obesity Paternal Grandmother   Colon cancer Paternal Grandfather   Alcohol abuse Paternal Grandfather   Bipolar disorder Maternal Aunt   Social History:   Socioeconomic History:   Marital status: Divorced  Spouse name: Not on file   Number of children: 6   Years of education: Not on file   Highest education level: Not on file  Occupational History   Occupation: middle school teacher  Tobacco Use   Smoking status: Never Smoker   Smokeless tobacco: Never Used  Substance and Sexual Activity   Alcohol use: Yes  Alcohol/week: 4.0 - 5.0 standard drinks  Types: 5 Standard drinks or equivalent per week   Drug use: No  Sexual activity: Yes  Partners: Male  Birth control/protection: Post-menopausal, Surgical  Comment: ablation   Other Topics/Concerns:   Would you please tell us about the people who live in your home, your pets, or anything else important to your social life? Yes  Social History Narrative:  The patient is married, she lives with her husband and 6  children. She works as a Patent examiner. No physical activity. She has an alcoholic beverage every night.   Social Determinants of Health:   Emergency planning/management officer Strain:   Difficulty of Paying Living Expenses:  Food Insecurity:   Worried About Charity fundraiser in the Last Year:   Arboriculturist in the Last Year:  Transportation Needs:   Film/video editor (Medical):   Lack of Transportation (Non-Medical):   Review of Systems:  A comprehensive 14 point ROS was performed, reviewed, and the pertinent orthopaedic findings are documented in the HPI.  Physical Exam: Vitals:  01/17/20 1514  Weight: (!) 127.5 kg (281 lb)  Height: 160.3 cm (5' 3.11")  PainSc: 0-No pain   General/Constitutional: Pleasant significantly overweight middle-aged female in no acute distress. Neuro/Psych: Normal mood and affect, oriented to person, place and time. Eyes: Non-icteric. Pupils are equal, round, and reactive to light, and exhibit synchronous movement. Lymphatic: No palpable adenopathy. Respiratory: Lungs clear to auscultation, Normal chest excursion, No wheezes and Non-labored breathing Cardiovascular: Regular rate and rhythm. No murmurs. and No edema, swelling or tenderness, except as noted in detailed exam. Vascular: No edema, swelling or tenderness, except as noted in detailed exam. Integumentary: No impressive skin lesions present, except as noted in detailed exam. Musculoskeletal: Unremarkable, except as noted in detailed exam.  Left knee exam: GAIT: mild limp and uses no assistive devices. ALIGNMENT: mild varus SKIN: unremarkable SWELLING: mild EFFUSION: small WARMTH: no warmth TENDERNESS: Mild-moderate tenderness over the lateral joint line and medial joint line ROM: 0 to 100 degrees with mild discomfort at the extremes of flexion and extension McMURRAY'S: equivocal PATELLOFEMORAL: normal tracking with no peri-patellar tenderness and negative apprehension sign CREPITUS:  Mild patellofemoral crepitance LACHMAN'S: negative PIVOT SHIFT: negative ANTERIOR DRAWER: negative POSTERIOR DRAWER: negative VARUS/VALGUS: Mildly positive pseudolaxity to varus stressing  She is neurovascularly intact to the left lower extremity and foot.  Knee Imaging, external: Left knee: A recent MRI scan of the left knee is available for review. By report, the scan demonstrates evidence of a complex degenerative tear involving the medial meniscus with medial extrusion and anterior horn parameniscal cysts. There also is evidence of significant degenerative changes of the medial and patellofemoral compartments as well as more moderate degenerative changes of the lateral compartment. She also exhibits a large Baker's cyst posteriorly. These results were reviewed by myself and discussed with the patient.  Assessment:   Primary osteoarthritis of left knee   Instability of left knee joint   Morbid obesity with BMI of 45.0-49.9, adult (CMS-HCC)   Plan: The treatment options were discussed with the patient. In addition, patient educational materials were provided regarding the diagnosis and treatment options. The patient is quite frustrated by her persistent symptoms and functional limitations, and is ready to consider more aggressive treatment options. Given the severity of degenerative changes noted on her recent MRI scan, I do not feel that she would be a candidate for anything short of a total knee arthroplasty. Therefore, I have recommended this procedure to her. The procedure was discussed with the patient, as were the potential risks (including bleeding, infection, nerve and/or  blood vessel injury, persistent or recurrent pain, loosening and/or failure of the components, dislocation, need for further surgery, blood clots, strokes, heart attacks and/or arhythmias, pneumonia, etc.) and benefits. The patient states her understanding and wishes to proceed. All of the patient's questions and  concerns were answered. She can call any time with further concerns. She will return to work without restrictions. She will follow up post-surgery, routine.    H&P reviewed and patient re-examined. No changes.

## 2020-01-26 NOTE — Op Note (Signed)
01/26/2020  12:47 PM  Patient:   Holly Henderson  Pre-Op Diagnosis:   Degenerative joint disease, left knee.  Post-Op Diagnosis:   Same  Procedure:   Left TKA using all-cemented Biomet Vanguard system with a 65 mm PCR femur, a 71 mm tibial tray with a 10 mm anterior stabilized E-poly insert, and a 34 x 8.5 mm all-poly 3-pegged domed patella.  Surgeon:   Pascal Lux, MD  Assistant:   Cameron Proud, PA-C; Kirkland Hun, PA-S  Anesthesia:   Spinal  Findings:   As above  Complications:   None  EBL:   10 cc  Fluids:   800 cc crystalloid  UOP:   None  TT:   95 minutes at 300 mmHg  Drains:   None  Closure:   Staples  Implants:   As above  Brief Clinical Note:   The patient is a 64 year old female with a long history of progressively worsening left knee pain. The patient's symptoms have progressed despite medications, activity modification, injections, etc. The patient's history and examination were consistent with advanced degenerative joint disease of the left knee confirmed by plain radiographs. The patient presents at this time for a left total knee arthroplasty.  Procedure:   The patient was brought into the operating room. After adequate spinal anesthesia was obtained, the patient was lain in the supine position.  The left lower extremity was prepped with ChloraPrep solution and draped sterilely. Preoperative antibiotics were administered. After verifying the proper laterality with a surgical timeout, the limb was exsanguinated with an Esmarch and the tourniquet inflated to 300 mmHg.   A standard anterior approach to the knee was made through an approximately 7 inch incision. The incision was carried down through the subcutaneous tissues to expose superficial retinaculum. This was split the length of the incision and the medial flap elevated sufficiently to expose the medial retinaculum. The medial retinaculum was incised, leaving a 3-4 mm cuff of tissue on the patella.  This was extended distally along the medial border of the patellar tendon and proximally through the medial third of the quadriceps tendon. A subtotal fat pad excision was performed before the soft tissues were elevated off the anteromedial and anterolateral aspects of the proximal tibia to the level of the collateral ligaments. The anterior portions of the medial and lateral menisci were removed, as was the anterior cruciate ligament. With the knee flexed to 90, the external tibial guide was positioned and the appropriate proximal tibial cut made. This piece was taken to the back table where it was measured and found to be optimally replicated by a 71 mm component.  Attention was directed to the distal femur. The intramedullary canal was accessed through a 3/8" drill hole. The intramedullary guide was inserted and positioned in order to obtain a neutral flexion gap. The intercondylar block was positioned with care taken to avoid notching the anterior cortex of the femur. The appropriate cut was made. Next, the distal cutting block was placed at 5 of valgus alignment. Using the 9 mm slot, the distal cut was made. The distal femur was measured and found to be optimally replicated by the 65 mm component. The 65 mm 4-in-1 cutting block was positioned and first the posterior, then the posterior chamfer, the anterior chamfer, and finally the anterior cuts were made. At this point, the posterior portions medial and lateral menisci were removed. A trial reduction was performed using the appropriate femoral and tibial components with the 10 mm insert. This  demonstrated excellent stability to varus and valgus stressing both in flexion and extension while permitting full extension. Patella tracking was assessed and found to be excellent. Therefore, the tibial guide position was marked on the proximal tibia. The patella thickness was measured and found to be 20 mm. Therefore, the appropriate cut was made. The patellar  surface was measured and found to be optimally replicated by the 34 mm component. The three peg holes were drilled in place before the trial button was inserted. Patella tracking was assessed and found to be excellent, passing the "no thumb test". The lug holes were drilled into the distal femur before the trial component was removed, leaving only the tibial tray. The keel was then created using the appropriate tower, reamer, and punch.  The bony surfaces were prepared for cementing by irrigating them thoroughly with bacitracin saline solution via the jet lavage system. A bone plug was fashioned from some of the bone that had been removed previously and used to plug the distal femoral canal. In addition, 20 cc of Exparel diluted out to 60 cc with normal saline and 30 cc of 0.5% Sensorcaine were injected into the postero-medial and postero-lateral aspects of the knee, the medial and lateral gutter regions, and the peri-incisional tissues to help with postoperative analgesia. Meanwhile, the cement was being mixed on the back table. When it was ready, the tibial tray was cemented in first. The excess cement was removed using [employer] fast streamer. Next, the femoral component was impacted into place. Again, the excess cement was removed using [employer] fast streamer. The 10 mm trial insert was positioned and the knee brought into extension while the cement hardened. Finally, the patella was cemented into place and secured using the patellar clamp. Again, the excess cement was removed using [employer] fast streamer. Once the cement had hardened, the knee was placed through a range of motion with the findings as described above. Therefore, the trial insert was removed and, after verifying that no cement had been retained posteriorly, the permanent 10 mm mm anterior stabilized E-polyethylene insert was positioned and secured using the appropriate key locking mechanism. Again the knee was placed through a range of motion with the findings as  described above.  The wound was copiously irrigated with sterile saline solution using the jet lavage system before the quadriceps tendon and retinacular layer were reapproximated using #0 Vicryl interrupted sutures. The superficial retinacular layer also was closed using a running #0 Vicryl suture. A total of 10 cc of transexemic acid (TXA) was injected intra-articularly before the subcutaneous tissues were closed in several layers using 2-0 Vicryl interrupted sutures. The skin was closed using staples. A sterile honeycomb dressing was applied to the skin before the leg was wrapped with an Ace wrap to accommodate the Polar Care device. The patient was then awakened and returned to the recovery room in satisfactory condition after tolerating the procedure well.

## 2020-01-26 NOTE — Anesthesia Preprocedure Evaluation (Signed)
Anesthesia Evaluation  Patient identified by MRN, date of birth, ID band Patient awake    Reviewed: Allergy & Precautions, H&P , NPO status , Patient's Chart, lab work & pertinent test results, reviewed documented beta blocker date and time   History of Anesthesia Complications Negative for: history of anesthetic complications  Airway Mallampati: III  TM Distance: >3 FB Neck ROM: full    Dental  (+) Dental Advidsory Given, Edentulous Upper, Upper Dentures, Missing   Pulmonary neg pulmonary ROS,    Pulmonary exam normal breath sounds clear to auscultation       Cardiovascular Exercise Tolerance: Good hypertension, (-) angina(-) Past MI and (-) Cardiac Stents Normal cardiovascular exam(-) dysrhythmias (-) Valvular Problems/Murmurs Rhythm:regular Rate:Normal     Neuro/Psych negative neurological ROS  negative psych ROS   GI/Hepatic negative GI ROS, Neg liver ROS,   Endo/Other  neg diabetesMorbid obesity  Renal/GU negative Renal ROS  negative genitourinary   Musculoskeletal   Abdominal   Peds  Hematology negative hematology ROS (+)   Anesthesia Other Findings Past Medical History: No date: Hypertension   Reproductive/Obstetrics negative OB ROS                             Anesthesia Physical Anesthesia Plan  ASA: III  Anesthesia Plan: Spinal   Post-op Pain Management:    Induction:   PONV Risk Score and Plan: 3 and Propofol infusion and TIVA  Airway Management Planned: Natural Airway and Nasal Cannula  Additional Equipment:   Intra-op Plan:   Post-operative Plan: Extubation in OR  Informed Consent: I have reviewed the patients History and Physical, chart, labs and discussed the procedure including the risks, benefits and alternatives for the proposed anesthesia with the patient or authorized representative who has indicated his/her understanding and acceptance.     Dental  Advisory Given  Plan Discussed with: Anesthesiologist, CRNA and Surgeon  Anesthesia Plan Comments:         Anesthesia Quick Evaluation

## 2020-01-26 NOTE — Progress Notes (Signed)
Anticoagulation monitoring(Lovenox):  64 yo female ordered Lovenox 40 mg Q24h  Filed Weights   01/26/20 0850  Weight: 127 kg (279 lb 15.8 oz)   BMI 47   Lab Results  Component Value Date   CREATININE 0.63 01/17/2020   Estimated Creatinine Clearance: 95.8 mL/min (by C-G formula based on SCr of 0.63 mg/dL). Hemoglobin & Hematocrit     Component Value Date/Time   HGB 13.8 01/17/2020 1313   HCT 40.8 01/17/2020 1313     Per Protocol for Patient with estCrcl > 30 ml/min and BMI > 40, will transition to Lovenox 40 mg Q12h.

## 2020-01-26 NOTE — Anesthesia Postprocedure Evaluation (Signed)
Anesthesia Post Note  Patient: Holly Henderson  Procedure(s) Performed: TOTAL KNEE ARTHROPLASTY (Left Knee)  Patient location during evaluation: PACU Anesthesia Type: Spinal Level of consciousness: awake and alert Pain management: pain level controlled Vital Signs Assessment: post-procedure vital signs reviewed and stable Respiratory status: spontaneous breathing and respiratory function stable Cardiovascular status: stable Anesthetic complications: no     Last Vitals:  Vitals:   01/26/20 1433 01/26/20 1452  BP: 113/89 134/74  Pulse: 71 60  Resp: (!) 22 15  Temp:    SpO2: 98% 99%    Last Pain:  Vitals:   01/26/20 1452  TempSrc:   PainSc: 0-No pain                 Shannan Slinker K

## 2020-01-26 NOTE — Anesthesia Procedure Notes (Signed)
Spinal  Patient location during procedure: OR Staffing Performed: resident/CRNA and other anesthesia staff  Anesthesiologist: Martha Clan, MD Resident/CRNA: Rolla Plate, CRNA Other anesthesia staff: Nolon Lennert, RN Preanesthetic Checklist Completed: patient identified, IV checked, site marked, risks and benefits discussed, surgical consent, monitors and equipment checked, pre-op evaluation and timeout performed Spinal Block Patient position: sitting Prep: ChloraPrep and site prepped and draped Patient monitoring: heart rate, continuous pulse ox, blood pressure and cardiac monitor Approach: midline Location: L3-4 Injection technique: single-shot Needle Needle type: Quincke  Needle gauge: 22 G Needle length: 12.7 cm Additional Notes Negative paresthesia. Negative blood return. Positive free-flowing CSF. Expiration date of kit checked and confirmed. Patient tolerated procedure well, without complications.

## 2020-01-26 NOTE — Evaluation (Addendum)
Physical Therapy Evaluation Patient Details Name: Holly Henderson MRN: [id number] DOB: 1956-03-26 Today's Date: 01/26/2020   History of Present Illness  64 y/o female s/p L TKA 6/3.  Clinical Impression  Pt eager to work with PT but hesitant secondary to pain and fear of increased pain.  She ultimately was able to do well with mobility and minimal gait effort.  She struggled some with exercises (pain limited) but was able to achieve ROM up to 80s and though she could not do fully AROM SLRs she needed only light AAROM.  Issued HEP, pt with good understanding.    Follow Up Recommendations Home health PT;Follow surgeon's recommendation for DC plan and follow-up therapies    Equipment Recommendations  (may need BSC, has toilet riser)    Recommendations for Other Services       Precautions / Restrictions Precautions Precautions: Fall;Knee Precaution Booklet Issued: Yes (comment)(HEP) Restrictions Weight Bearing Restrictions: Yes LLE Weight Bearing: Weight bearing as tolerated      Mobility  Bed Mobility Overal bed mobility: Modified Independent             General bed mobility comments: Pt was able to get to sitting from supine and then back to supine from sitting wo/ direct assist  Transfers Overall transfer level: Needs assistance Equipment used: Rolling walker (2 wheeled) Transfers: Sit to/from Stand Sit to Stand: Min guard         General transfer comment: With plenty of cuing and encourgement she was able to slowly rise w/o direct assist  Ambulation/Gait Ambulation/Gait assistance: Min guard Gait Distance (Feet): 10 Feet Assistive device: Rolling walker (2 wheeled)       General Gait Details: Pt able to transfer to [facility] and then rise and walk around foot of bed with slow, guarded but safe cadence using heavy reliance on the walker  Stairs            Wheelchair Mobility    Modified Rankin (Stroke Patients Only)       Balance Overall balance  assessment: Modified Independent                                           Pertinent Vitals/Pain Pain Assessment: 0-10 Pain Score: 8  Pain Location: L knee, increases with all activity    Home Living Family/patient expects to be discharged to:: Private residence Living Arrangements: Children Available Help at Discharge: Family;Available 24 hours/day Type of Home: House Home Access: Stairs to enter Entrance Stairs-Rails: None Entrance Stairs-Number of Steps: 2 Home Layout: Two level;Bed/bath upstairs Home Equipment: Walker - 2 wheels;Walker - 4 wheels      [place] Function Level of Independence: Independent         Comments: 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, has struggled with mobiltiy recently, but able to manage all she needs      Hand Dominance        Extremity/Trunk Assessment   Upper Extremity Assessment Upper Extremity Assessment: Overall WFL for tasks assessed    Lower Extremity Assessment Lower Extremity Assessment: (expected post-op weakness, unable to do I SLRs)       Communication   Communication: No difficulties  Cognition Arousal/Alertness: Awake/alert Behavior During Therapy: WFL for tasks assessed/performed Overall Cognitive Status: Within Functional Limits for tasks assessed  General Comments      Exercises Total Joint Exercises Ankle Circles/Pumps: AROM;10 reps Quad Sets: Strengthening;10 reps Short Arc Quad: AAROM;AROM;10 reps Heel Slides: AAROM;5 reps Hip ABduction/ADduction: Strengthening;5 reps Straight Leg Raises: AAROM;5 reps(improved with reps, but needed AAROM t/o) Knee Flexion: PROM;5 reps Goniometric ROM: 1-83   Assessment/Plan    PT Assessment Patient needs continued PT services  PT Problem List Decreased strength;Decreased range of motion;Decreased activity tolerance;Decreased balance;Decreased mobility;Decreased coordination;Decreased knowledge of use of  DME;Decreased safety awareness;Pain       PT Treatment Interventions Gait training;DME instruction;Stair training;Functional mobility training;Therapeutic activities;Therapeutic exercise;Balance training;Neuromuscular re-education;Patient/family education    PT Goals (Current goals can be found in the Care Plan section)  Acute Rehab PT Goals Patient Stated Goal: go home PT Goal Formulation: With patient Time For Goal Achievement: 02/09/20 Potential to Achieve Goals: Good    Frequency BID   Barriers to discharge        Co-evaluation               AM-PAC PT "6 Clicks" Mobility  Outcome Measure Help needed turning from your back to your side while in a flat bed without using bedrails?: None Help needed moving from lying on your back to sitting on the side of a flat bed without using bedrails?: None Help needed moving to and from a bed to a chair (including a wheelchair)?: A Little Help needed standing up from a chair using your arms (e.g., wheelchair or bedside chair)?: A Little Help needed to walk in hospital room?: A Little Help needed climbing 3-5 steps with a railing? : A Lot 6 Click Score: 19    End of Session Equipment Utilized During Treatment: Gait belt Activity Tolerance: Patient tolerated treatment well;Patient limited by pain Patient left: in bed;with call bell/phone within reach;with nursing/sitter in room Nurse Communication: Mobility status(possible need for pain meds) PT Visit Diagnosis: Muscle weakness (generalized) (M62.81);Difficulty in walking, not elsewhere classified (R26.2);Pain Pain - Right/Left: Left Pain - part of body: Knee    Time: [id number] PT Time Calculation (min) (ACUTE ONLY): 45 min   Charges:   PT Evaluation $PT Eval Low Complexity: 1 Low PT Treatments $Gait Training: 8-22 mins $Therapeutic Exercise: 8-22 mins        Kreg Shropshire, DPT 01/26/2020, 5:51 PM

## 2020-01-27 LAB — BASIC METABOLIC PANEL
Anion gap: 7 (ref 5–15)
BUN: 15 mg/dL (ref 8–23)
CO2: 27 mmol/L (ref 22–32)
Calcium: 8.7 mg/dL — ABNORMAL LOW (ref 8.9–10.3)
Chloride: 104 mmol/L (ref 98–111)
Creatinine, Ser: 0.86 mg/dL (ref 0.44–1.00)
GFR calc Af Amer: >60 mL/min (ref >60)
GFR calc non Af Amer: >60 mL/min (ref >60)
Glucose, Bld: 138 mg/dL — ABNORMAL HIGH (ref 70–99)
Potassium: 3.8 mmol/L (ref 3.5–5.1)
Sodium: 138 mmol/L (ref 135–145)

## 2020-01-27 LAB — CBC
HCT: 35 % — ABNORMAL LOW (ref 36.0–46.0)
Hemoglobin: 11.4 g/dL — ABNORMAL LOW (ref 12.0–15.0)
MCH: 30.2 pg (ref 26.0–34.0)
MCHC: 32.6 g/dL (ref 30.0–36.0)
MCV: 92.8 fL (ref 80.0–100.0)
Platelets: 198 10*3/uL (ref 150–400)
RBC: 3.77 MIL/uL — ABNORMAL LOW (ref 3.87–5.11)
RDW: 13.2 % (ref 11.5–15.5)
WBC: 10.5 10*3/uL (ref 4.0–10.5)
nRBC: 0 % (ref 0.0–0.2)

## 2020-01-27 MED ORDER — KETOROLAC TROMETHAMINE 15 MG/ML IJ SOLN
INTRAMUSCULAR | Status: AC
  Administered 2020-01-27: 15 mg
  Filled 2020-01-27: qty 1

## 2020-01-27 MED ORDER — OXYCODONE HCL 5 MG PO TABS: 5.0000 mg | ORAL_TABLET | ORAL | 0 refills | Status: DC | PRN

## 2020-01-27 MED ORDER — KETOROLAC TROMETHAMINE 30 MG/ML IJ SOLN
INTRAMUSCULAR | Status: AC
  Filled 2020-01-27: qty 1

## 2020-01-27 MED ORDER — ENOXAPARIN SODIUM 40 MG/0.4ML ~~LOC~~ SOLN: 40.0000 mg | Freq: Two times a day (BID) | SUBCUTANEOUS | 0 refills | Status: DC

## 2020-01-27 MED ORDER — TRAMADOL HCL 50 MG PO TABS: 50.0000 mg | ORAL_TABLET | Freq: Four times a day (QID) | ORAL | 0 refills | Status: DC

## 2020-01-27 NOTE — Progress Notes (Signed)
Physical Therapy Treatment Patient Details Name: Holly Henderson MRN: [id number] DOB: December 16, 1955 Today's Date: 01/27/2020    History of Present Illness 64 y/o female s/p L TKA 6/3.    PT Comments    Pt continues to have expected post-op pain but showed great effort t/o the session and was able to do most activities reasonably well.  She has no issues with bed mobility, was able to circumambulate the nurses' station with consistent cadence and had ~85* of knee flexion.  Pt, however, is not yet able to do SLRs even with plenty of warm up and AAROM during initial reps.  Pt with good quad engagement, but pain simply limited her ability t to raise against gravity (limited SAQ to a lesser degree as well).  Overall pt doing functionally quite well and expect we will trial steps this afternoon.     Follow Up Recommendations  Home health PT;Follow surgeons recommendation for DC plan and follow-up therapies     Equipment Recommendations  None recommended by PT(may need BSC, though has stool riser)    Recommendations for Other Services       Precautions / Restrictions Precautions Precautions: Fall;Knee Precaution Booklet Issued: Yes (comment) Restrictions Weight Bearing Restrictions: Yes LLE Weight Bearing: Weight bearing as tolerated    Mobility  Bed Mobility Overal bed mobility: Independent             General bed mobility comments: Pt able to easily get to sitting EOB w/o assist  Transfers Overall transfer level: Needs assistance Equipment used: Rolling walker (2 wheeled) Transfers: Sit to/from Stand Sit to Stand: Min guard         General transfer comment: Pt still needing small reminders for set up and hand placement.  Ambulation/Gait Ambulation/Gait assistance: Min guard Gait Distance (Feet): 200 Feet Assistive device: Rolling walker (2 wheeled)       General Gait Details: Pt able to circumambulate the nurses' station with good confidence and appropriate  speed.  She had very little hesitation/limp even from the first step.  She had one small knee buckle that she easily self arrested, but never needed excessive UE use on walker and was able to maintain consistent and symmetrical cadence t/o.   Stairs             Wheelchair Mobility    Modified Rankin (Stroke Patients Only)       Balance Overall balance assessment: Modified Independent                                          Cognition Arousal/Alertness: Awake/alert Behavior During Therapy: WFL for tasks assessed/performed Overall Cognitive Status: Within Functional Limits for tasks assessed                                        Exercises Total Joint Exercises Ankle Circles/Pumps: AROM;10 reps Quad Sets: Strengthening;10 reps Short Arc Quad: AAROM;AROM;10 reps Heel Slides: AROM;10 reps(with resisted leg extensions) Hip ABduction/ADduction: Strengthening;5 reps Straight Leg Raises: AAROM;10 reps(unable to do AROM, though very close by the last few reps) Knee Flexion: PROM;5 reps Goniometric ROM: 1-85    General Comments        Pertinent Vitals/Pain Pain Assessment: 0-10 Pain Score: 5 (incrases with all activity) Pain Location: L knee, increases with all activity  Home Living                      Prior Function            PT Goals (current goals can now be found in the care plan section) Progress towards PT goals: Progressing toward goals    Frequency    BID      PT Plan Current plan remains appropriate    Co-evaluation              AM-PAC PT "6 Clicks" Mobility   Outcome Measure  Help needed turning from your back to your side while in a flat bed without using bedrails?: None Help needed moving from lying on your back to sitting on the side of a flat bed without using bedrails?: None Help needed moving to and from a bed to a chair (including a wheelchair)?: None Help needed standing up from a  chair using your arms (e.g., wheelchair or bedside chair)?: None Help needed to walk in hospital room?: None Help needed climbing 3-5 steps with a railing? : A Little 6 Click Score: 23    End of Session Equipment Utilized During Treatment: Gait belt Activity Tolerance: Patient tolerated treatment well;Patient limited by pain Patient left: with chair alarm set;with call bell/phone within reach;with family/visitor present Nurse Communication: Mobility status PT Visit Diagnosis: Muscle weakness (generalized) (M62.81);Difficulty in walking, not elsewhere classified (R26.2);Pain Pain - Right/Left: Left Pain - part of body: Knee     Time: 0840-0920 PT Time Calculation (min) (ACUTE ONLY): 40 min  Charges:  $Gait Training: 8-22 mins $Therapeutic Exercise: 23-37 mins                     Kreg Shropshire, DPT 01/27/2020, 10:58 AM

## 2020-01-27 NOTE — Progress Notes (Signed)
Physical Therapy Treatment Patient Details Name: Holly Henderson MRN: [id number] DOB: Dec 24, 1955 Today's Date: 01/27/2020    History of Present Illness 64 y/o female s/p L TKA 6/3.    PT Comments    Pt was seated in recliner upon arrinvg. She is highly motivated and agreeable to PT session. She was able to stand from recliner with supervision. Ambulate 200 ft with RW with CGA/supervision. No LOB or unsteadiness. She also performed ascending/descending 4 stair with BUE support on L rail. Performed step to pattern without assistance. Pt states confidence in her abilities. Returned to room and was repositioned in recliner. Performed several there ex and pt tolerated well. She continues to progress with PT and will benefit from HHPT at DC to address strength, ROM, and safe functional mobility deficits while assisting pt to PLOF.     Follow Up Recommendations  Home health PT;Follow surgeon's recommendation for DC plan and follow-up therapies     Equipment Recommendations  3in1 (PT)    Recommendations for Other Services       Precautions / Restrictions Precautions Precautions: Fall;Knee Precaution Booklet Issued: Yes (comment) Restrictions Weight Bearing Restrictions: Yes LLE Weight Bearing: Weight bearing as tolerated    Mobility  Bed Mobility Overal bed mobility: Independent                Transfers Overall transfer level: Needs assistance Equipment used: Rolling walker (2 wheeled) Transfers: Sit to/from Stand Sit to Stand: Supervision            Ambulation/Gait Ambulation/Gait assistance: Min guard Gait Distance (Feet): 200 Feet Assistive device: Rolling walker (2 wheeled) Gait Pattern/deviations: Step-through pattern;Antalgic Gait velocity: decreased   General Gait Details: pt was able to ambulate to rehab gym, perform stairs and then ambulate back to room with only 1 standing rest. no LOB or unsteadiness noted.   Stairs Stairs: Yes Stairs  assistance: Supervision Stair Management: One rail Left;Step to pattern(BUE support on one rail) Number of Stairs: 4 General stair comments: pt demonstrated safe ability to ascend/descend 4 stair with +1 rail with BUE support performing step to pattern   Wheelchair Mobility    Modified Rankin (Stroke Patients Only)       Balance                                            Cognition Arousal/Alertness: Awake/alert Behavior During Therapy: WFL for tasks assessed/performed Overall Cognitive Status: Within Functional Limits for tasks assessed                                 General Comments: Pt is A and O x 4      Exercises Total Joint Exercises Ankle Circles/Pumps: AROM;10 reps Quad Sets: Strengthening;10 reps Heel Slides: AROM;10 reps Hip ABduction/ADduction: Strengthening;5 reps Knee Flexion: AROM;5 reps;Seated    General Comments        Pertinent Vitals/Pain Pain Assessment: 0-10 Pain Score: 5  Pain Location: L knee, increases with all activity    Home Living                      Prior Function            PT Goals (current goals can now be found in the care plan section) Acute Rehab PT Goals Patient Stated Goal:  go home Progress towards PT goals: Progressing toward goals    Frequency    BID      PT Plan Current plan remains appropriate    Co-evaluation              AM-PAC PT "6 Clicks" Mobility   Outcome Measure  Help needed turning from your back to your side while in a flat bed without using bedrails?: None Help needed moving from lying on your back to sitting on the side of a flat bed without using bedrails?: None Help needed moving to and from a bed to a chair (including a wheelchair)?: None Help needed standing up from a chair using your arms (e.g., wheelchair or bedside chair)?: None Help needed to walk in hospital room?: None Help needed climbing 3-5 steps with a railing? : A Little 6 Click  Score: 23    End of Session Equipment Utilized During Treatment: Gait belt Activity Tolerance: Patient tolerated treatment well;Patient limited by pain Patient left: with chair alarm set;with call bell/phone within reach;with family/visitor present Nurse Communication: Mobility status PT Visit Diagnosis: Muscle weakness (generalized) (M62.81);Difficulty in walking, not elsewhere classified (R26.2);Pain Pain - Right/Left: Left Pain - part of body: Knee     Time: 1312-1350 PT Time Calculation (min) (ACUTE ONLY): 38 min  Charges:  $Gait Training: 23-37 mins $Therapeutic Exercise: 8-22 mins                     Julaine Fusi PTA 01/27/20, 2:13 PM

## 2020-01-27 NOTE — TOC Initial Note (Signed)
Transition of Care [facility]) - Initial/Assessment Note    Patient Details  Name: Holly Henderson MRN: [id number] Date of Birth: 12/12/1955  Transition of Care [facility]) CM/SW Contact:    Shelbie Ammons, RN Phone Number: 01/27/2020, 2:29 PM  Clinical Narrative:    RNCM met with patient at bedside, patient sitting up in chair with son present in room. Reports to feeling well today. RNCM discussed recommendations that she have PT at home and patient reports that her MD has already set her up with [facility] for the first 2 weeks and then after that she will go to outpatient. Patient is agreeable to 3n1 at home and reports that she already has a walker both 2 wheel and 4 wheel. Verified patient is to be followed by [facility] at discharge. Placed call to Zack with [employer] and he will make arrangements for patient to get 3n1. RNCM will follow for further needs.       Expected Discharge Plan: [place] Barriers to Discharge: No Barriers Identified   Patient Goals and CMS Choice        Expected Discharge Plan and Services Expected Discharge Plan: [place]   Discharge Planning Services: CM Consult Post Acute Care Choice: [place] arrangements for the past 2 months: Single Family Home                 DME Arranged: 3-N-1 DME Agency: [employer] Date DME Agency Contacted: 01/27/20 Time DME Agency Contacted: 76 Representative spoke with at DME Agency: [place]: PT [place]: [facility] [employer] [facility] [employer]        Prior Living Arrangements/Services Living arrangements for the past 2 months: [place] with:: Self Patient language and need for interpreter reviewed:: Yes Do you feel safe going back to the place where you live?: Yes      Need for Family Participation in Patient Care: Yes (Comment) Care giver support system in place?: Yes (comment)   Criminal Activity/Legal Involvement Pertinent to Current  Situation/Hospitalization: No - Comment as needed  Activities of Daily Living Home Assistive Devices/Equipment: None ADL Screening (condition at time of admission) Patient's cognitive ability adequate to safely complete daily activities?: Yes Is the patient deaf or have difficulty hearing?: No Does the patient have difficulty seeing, even when wearing glasses/contacts?: Yes Does the patient have difficulty concentrating, remembering, or making decisions?: No Patient able to express need for assistance with ADLs?: No Does the patient have difficulty dressing or bathing?: Yes Independently performs ADLs?: Yes (appropriate for developmental age) Does the patient have difficulty walking or climbing stairs?: Yes Weakness of Legs: Left Weakness of Arms/Hands: None  Permission Sought/Granted                  Emotional Assessment Appearance:: Appears stated age Attitude/Demeanor/Rapport: Engaged Affect (typically observed): Appropriate Orientation: : Oriented to Self, Oriented to Place, Oriented to  Time, Oriented to Situation Alcohol / Substance Use: Not Applicable Psych Involvement: No (comment)  Admission diagnosis:  Status post total knee replacement using cement, 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 Status post total knee replacement using cement, left 01/26/2020   PCP:  Nelwyn Salisbury, PA-C Pharmacy:   [facility] [address] [place] [address] [place] [address] Phone: [phone] Fax: [phone]     Social Determinants of Health (SDOH) Interventions    Readmission Risk Interventions No  flowsheet data found.

## 2020-01-27 NOTE — Discharge Instructions (Signed)

## 2020-01-27 NOTE — Discharge Summary (Signed)
Physician Discharge Summary  Patient ID: Holly Henderson MRN: [id number] DOB/AGE: [birth year] 64 y.o.  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 Status post total knee replacement using cement, left 01/26/2020    Past Medical History:  Diagnosis Date  . Hypertension      Transfusion: None.   Consultants (if any):   Discharged Condition: Improved  Hospital Course: Holly Henderson is an 64 y.o. female who was admitted 01/26/2020 with a diagnosis of primary osteoarthritis of the left knee and went to the operating room on 01/26/2020 and underwent the above named procedures.    Surgeries: Procedure(s): TOTAL KNEE ARTHROPLASTY on 01/26/2020 Patient tolerated the surgery well. Taken to PACU where she was stabilized and then transferred to the orthopedic floor.  Started on Lovenox 40mg  q 12 hrs. Foot pumps applied bilaterally at 80 mm. Heels elevated on bed with rolled towels. No evidence of DVT. Negative [place]. Physical therapy started on day #1 for gait training and transfer. OT started day #1 for ADL and assisted devices.    Implants: Left TKA using all-cemented Biomet Vanguard system with a 65 mm PCR femur, a 71 mm tibial tray with a 10 mm anterior stabilized E-poly insert, and a 34 x 8.5 mm all-poly 3-pegged domed patella.  She was given perioperative antibiotics:  Anti-infectives (From admission, onward)   Start     Dose/Rate Route Frequency Ordered Stop   01/26/20 2000  ceFAZolin (ANCEF) 3 g in dextrose 5 % 50 mL IVPB     3 g 100 mL/hr over 30 Minutes Intravenous Every 6 hours 01/26/20 1957 01/27/20 1045   01/26/20 0900  ceFAZolin (ANCEF) 3 g in dextrose 5 % 50 mL IVPB     3 g 100 mL/hr over 30 Minutes Intravenous On call to O.R. 01/26/20 [id number] 01/26/20 1034    .  She was given sequential  compression devices, early ambulation, and Lovenox for DVT prophylaxis.  She benefited maximally from the hospital stay and there were no complications.    Recent vital signs:  Vitals:   01/29/20 0022 01/29/20 0916  BP: 125/73 120/68  Pulse: (!) 109 (!) 106  Resp: 16 18  Temp: 98.2 F (36.8 C) 98.7 F (37.1 C)  SpO2: 96% 100%    Recent laboratory studies:  Lab Results  Component Value Date   HGB 11.2 (L) 01/28/2020   HGB 11.4 (L) 01/27/2020   HGB 13.8 01/17/2020   Lab Results  Component Value Date   WBC 13.1 (H) 01/28/2020   PLT 188 01/28/2020   No results found for: INR Lab Results  Component Value Date   NA 135 01/29/2020   K 4.7 01/29/2020   CL 101 01/29/2020   CO2 28 01/29/2020   BUN 11 01/29/2020   CREATININE 0.69 01/29/2020   GLUCOSE 134 (H) 01/29/2020    Discharge Medications:   Allergies as of 01/29/2020   No Known Allergies     Medication List    TAKE these medications   Calcium-Magnesium-Zinc-D3 Tabs Take 1 tablet by mouth in the morning and at bedtime.   enoxaparin 40 MG/0.4ML injection Commonly known as: LOVENOX Inject 0.4 mLs (40 mg total) into the skin every 12 (twelve) hours.   etodolac 500 MG tablet Commonly known as: LODINE Take 500 mg by mouth 2 (two) times daily.   losartan 25 MG tablet  Commonly known as: COZAAR Take 25 mg by mouth daily.   oxyCODONE 5 MG immediate release tablet Commonly known as: Oxy IR/ROXICODONE Take 1-2 tablets (5-10 mg total) by mouth every 4 (four) hours as needed for moderate pain.   spironolactone 25 MG tablet Commonly known as: ALDACTONE Take 25 mg by mouth daily.   traMADol 50 MG tablet Commonly known as: ULTRAM Take 1 tablet (50 mg total) by mouth every 6 (six) hours.            Durable Medical Equipment  (From admission, onward)         Start     Ordered   01/26/20 1958  DME Bedside commode  Once    Question:  Patient needs a bedside commode to treat with the following condition   Answer:  Status post total knee replacement using cement, left   01/26/20 1957   01/26/20 1958  DME 3 n 1  Once     01/26/20 1957   01/26/20 1958  DME Walker rolling  Once    Question Answer Comment  Walker: With 5 Inch Wheels   Patient needs a walker to treat with the following condition Status post total knee replacement using cement, left      01/26/20 1957          Diagnostic Studies: DG Knee Left Port  Result Date: 01/26/2020 CLINICAL DATA:  Left knee arthroplasty EXAM: PORTABLE LEFT KNEE - 1-2 VIEW COMPARISON:  12/26/2019 FINDINGS: Interval postsurgical changes from left total knee arthroplasty. Arthroplasty components are in their expected alignment. No periprosthetic fracture. Expected postoperative changes within the overlying soft tissues including intra-articular air. IMPRESSION: Interval postsurgical changes from left total knee arthroplasty without evidence of complication. Electronically Signed   By: Davina Poke D.O.   On: 01/26/2020 15:32   Disposition: Plan for discharge home with [place] on 01/28/20 pending progress with PT while admitted.  Follow-up Information    Lattie Corns, PA-C Follow up in 14 day(s).   Specialty: Physician Assistant Why: Electa Sniff information: [place] [address] [phone]          Signed: Fausto Skillern PA-C 01/29/2020, 12:37 PM

## 2020-01-27 NOTE — Progress Notes (Signed)
  Subjective: 1 Day Post-Op Procedure(s) (LRB): TOTAL KNEE ARTHROPLASTY (Left) Patient reports pain as moderate.   Patient is well, and has had no acute complaints or problems Plan is to go Home after hospital stay. Negative for chest pain and shortness of breath Fever: no Gastrointestinal:Negative for nausea and vomiting  Objective: Vital signs in last 24 hours: Temp:  [97 F (36.1 C)-98.6 F (37 C)] 98.2 F (36.8 C) (06/04 0754) Pulse Rate:  [60-93] 74 (06/04 0754) Resp:  [9-22] 17 (06/04 0754) BP: (88-152)/(58-90) 143/76 (06/04 0754) SpO2:  [92 %-100 %] 99 % (06/04 0754)  Intake/Output from previous day:  Intake/Output Summary (Last 24 hours) at 01/27/2020 1143 Last data filed at 01/27/2020 0500 Gross per 24 hour  Intake 1841.71 ml  Output 360 ml  Net 1481.71 ml    Intake/Output this shift: No intake/output data recorded.  Labs: Recent Labs    01/27/20 0429  HGB 11.4*   Recent Labs    01/27/20 0429  WBC 10.5  RBC 3.77*  HCT 35.0*  PLT 198   Recent Labs    01/27/20 0429  NA 138  K 3.8  CL 104  CO2 27  BUN 15  CREATININE 0.86  GLUCOSE 138*  CALCIUM 8.7*   No results for input(s): LABPT, INR in the last 72 hours.   EXAM General - Patient is Alert, Appropriate and Oriented Extremity - ABD soft Neurovascular intact Sensation intact distally Intact pulses distally Dorsiflexion/Plantar flexion intact Incision: scant drainage No cellulitis present Dressing/Incision - Blood tinged drainage to the distal aspect of the incision. Motor Function - intact, moving foot and toes well on exam.  Abdomen soft with normal BS this morning.  Past Medical History:  Diagnosis Date  . Hypertension     Assessment/Plan: 1 Day Post-Op Procedure(s) (LRB): TOTAL KNEE ARTHROPLASTY (Left) Active Problems:   Status post total knee replacement using cement, left  Estimated body mass index is 49.6 kg/m as calculated from the following:   Height as of this  encounter: 5\' 3"  (1.6 m).   Weight as of this encounter: 127 kg. Advance diet Up with therapy D/C IV fluids when tolerating po intake.  Labs reviewed this AM. WBC 10.5, Hg 11.4. Continue with PT. Begin working on BM. Plan for possible d/c home tomorrow with HHPT.  DVT Prophylaxis - Lovenox, Foot Pumps and TED hose Weight-Bearing as tolerated to left leg  J. Cameron Proud, PA-C [facility]opaedic Surgery 01/27/2020, 11:43 AM

## 2020-01-28 LAB — CBC
HCT: 33.7 % — ABNORMAL LOW (ref 36.0–46.0)
Hemoglobin: 11.2 g/dL — ABNORMAL LOW (ref 12.0–15.0)
MCH: 30.5 pg (ref 26.0–34.0)
MCHC: 33.2 g/dL (ref 30.0–36.0)
MCV: 91.8 fL (ref 80.0–100.0)
Platelets: 188 10*3/uL (ref 150–400)
RBC: 3.67 MIL/uL — ABNORMAL LOW (ref 3.87–5.11)
RDW: 13.2 % (ref 11.5–15.5)
WBC: 13.1 10*3/uL — ABNORMAL HIGH (ref 4.0–10.5)
nRBC: 0 % (ref 0.0–0.2)

## 2020-01-28 LAB — BASIC METABOLIC PANEL
Anion gap: 7 (ref 5–15)
BUN: 15 mg/dL (ref 8–23)
CO2: 25 mmol/L (ref 22–32)
Calcium: 8.8 mg/dL — ABNORMAL LOW (ref 8.9–10.3)
Chloride: 104 mmol/L (ref 98–111)
Creatinine, Ser: 0.64 mg/dL (ref 0.44–1.00)
GFR calc Af Amer: >60 mL/min (ref >60)
GFR calc non Af Amer: >60 mL/min (ref >60)
Glucose, Bld: 155 mg/dL — ABNORMAL HIGH (ref 70–99)
Potassium: 4.4 mmol/L (ref 3.5–5.1)
Sodium: 136 mmol/L (ref 135–145)

## 2020-01-28 NOTE — Progress Notes (Signed)
Physical Therapy Treatment Patient Details Name: Holly Henderson MRN: [id number] DOB: February 22, 1956 Today's Date: 01/28/2020    History of Present Illness 64 y/o female s/p L TKA 6/3.    PT Comments    Pt is making gradual progress towards goals with ability to tolerate seated there-ex despite 10/10 pain. PA just changed bandage prior to session. Agreeable to there-ex and is awaiting pain meds, this therapist called RN during session. Pain limited this session. Will continue to progress.  Follow Up Recommendations  Home health PT     Equipment Recommendations  None recommended by PT    Recommendations for Other Services       Precautions / Restrictions Precautions Precautions: Fall;Knee Precaution Booklet Issued: Yes (comment) Restrictions Weight Bearing Restrictions: Yes LLE Weight Bearing: Weight bearing as tolerated    Mobility  Bed Mobility               General bed mobility comments: received in recliner  Transfers                 General transfer comment: deferred secondary to 10/10 pain  Ambulation/Gait                 Stairs             Wheelchair Mobility    Modified Rankin (Stroke Patients Only)       Balance                                            Cognition Arousal/Alertness: Awake/alert Behavior During Therapy: WFL for tasks assessed/performed Overall Cognitive Status: Within Functional Limits for tasks assessed                                        Exercises Total Joint Exercises Goniometric ROM: L knee AAROM: 70 degrees of flexion Other Exercises Other Exercises: Seated ther-ex performed on L LE including AP, quad sets, glut sets, hip abd/add, and knee flexion stretches. All ther-ex performed x 15 reps. Very painful with multiple rest breaks.    General Comments        Pertinent Vitals/Pain Pain Assessment: 0-10 Pain Score: 10-Worst pain ever Pain Location: L  knee, increases with all activity Pain Descriptors / Indicators: Discomfort;Heaviness;Tightness Pain Intervention(s): Limited activity within patient's tolerance;Patient requesting pain meds-RN notified;Ice applied    Home Living                      Prior Function            PT Goals (current goals can now be found in the care plan section) Acute Rehab PT Goals Patient Stated Goal: go home PT Goal Formulation: With patient Time For Goal Achievement: 02/09/20 Potential to Achieve Goals: Good Progress towards PT goals: Progressing toward goals    Frequency    BID      PT Plan Current plan remains appropriate    Co-evaluation              AM-PAC PT "6 Clicks" Mobility   Outcome Measure  Help needed turning from your back to your side while in a flat bed without using bedrails?: None Help needed moving from lying on your back to sitting on the side of a flat bed  without using bedrails?: None Help needed moving to and from a bed to a chair (including a wheelchair)?: None Help needed standing up from a chair using your arms (e.g., wheelchair or bedside chair)?: None Help needed to walk in hospital room?: None Help needed climbing 3-5 steps with a railing? : A Little 6 Click Score: 23    End of Session   Activity Tolerance: Patient limited by pain Patient left: with chair alarm set;with call bell/phone within reach Nurse Communication: Mobility status PT Visit Diagnosis: Muscle weakness (generalized) (M62.81);Difficulty in walking, not elsewhere classified (R26.2);Pain Pain - Right/Left: Left Pain - part of body: Knee     Time: 8469-6295 PT Time Calculation (min) (ACUTE ONLY): 23 min  Charges:  $Therapeutic Exercise: 23-37 mins                     Holly Henderson, PT, DPT [phone]    Holly Henderson 01/28/2020, 3:39 PM

## 2020-01-28 NOTE — Plan of Care (Signed)

## 2020-01-28 NOTE — Progress Notes (Signed)
Physical Therapy Treatment Patient Details Name: Holly Henderson MRN: [id number] DOB: 17-Dec-1955 Today's Date: 01/28/2020    History of Present Illness 64 y/o female s/p L TKA 6/3.    PT Comments    Pt was seated up in recliner upon arriving. She reports much increased pain this date versus previous date. Therapist explained that this is not unusual being POD #2. She agrees to PT session but is limited throughout session by pain. She was able to STS from recliner to [place] without difficulty. Ambulated 120 ft with slow step through gait pattern. No LOB or unsteadiness. Returned to room and reviewed there ex handout. AROM 2-84 degrees. Overall pt is progressing with PT. She performed stair training previous date and demonstrates safe abilities to DC to home. Will need continued skilled HHPT at DC to address strength, ROM, and safe functional mobility deficits. Pt will benefit from continued skilled PT to address deficits and assistbpt with returning to PLOF.     Follow Up Recommendations  Home health PT     Equipment Recommendations  None recommended by PT(pt has all equipment needs met)    Recommendations for Other Services       Precautions / Restrictions Precautions Precautions: Fall;Knee Precaution Booklet Issued: Yes (comment) Restrictions Weight Bearing Restrictions: Yes LLE Weight Bearing: Weight bearing as tolerated    Mobility  Bed Mobility Overal bed mobility: Independent             General bed mobility comments: pt was seated in recliner pre/post session  Transfers Overall transfer level: Needs assistance Equipment used: Rolling walker (2 wheeled) Transfers: Sit to/from Stand Sit to Stand: Supervision         General transfer comment: pt was able to STS from recliner without physical assistance  Ambulation/Gait Ambulation/Gait assistance: Supervision Gait Distance (Feet): 120 Feet Assistive device: Rolling walker (2 wheeled) Gait  Pattern/deviations: Step-through pattern;Antalgic Gait velocity: decreased   General Gait Details: pt demonstrated safe steady gait kinematics. No LOB or unsteadiness. limited distance this date 2/2 to pain   Stairs         General stair comments: pt performed stairs previous date without difficulty.   Wheelchair Mobility    Modified Rankin (Stroke Patients Only)       Balance                                            Cognition Arousal/Alertness: Awake/alert Behavior During Therapy: WFL for tasks assessed/performed Overall Cognitive Status: Within Functional Limits for tasks assessed                                 General Comments: Pt is A and O x 4      Exercises      General Comments        Pertinent Vitals/Pain Pain Assessment: 0-10 Pain Score: 7  Pain Location: L knee, increases with all activity Pain Descriptors / Indicators: Discomfort;Heaviness;Tightness Pain Intervention(s): Limited activity within patient's tolerance;Monitored during session;Premedicated before session;Repositioned;Ice applied    Home Living                      Prior Function            PT Goals (current goals can now be found in the care plan section) Acute  Rehab PT Goals Patient Stated Goal: go home Progress towards PT goals: Progressing toward goals    Frequency    BID      PT Plan Current plan remains appropriate    Co-evaluation              AM-PAC PT "6 Clicks" Mobility   Outcome Measure  Help needed turning from your back to your side while in a flat bed without using bedrails?: None Help needed moving from lying on your back to sitting on the side of a flat bed without using bedrails?: None Help needed moving to and from a bed to a chair (including a wheelchair)?: None Help needed standing up from a chair using your arms (e.g., wheelchair or bedside chair)?: None Help needed to walk in hospital room?:  None Help needed climbing 3-5 steps with a railing? : A Little 6 Click Score: 23    End of Session Equipment Utilized During Treatment: Gait belt Activity Tolerance: Patient tolerated treatment well;Patient limited by pain Patient left: with chair alarm set;with call bell/phone within reach;with family/visitor present Nurse Communication: Mobility status PT Visit Diagnosis: Muscle weakness (generalized) (M62.81);Difficulty in walking, not elsewhere classified (R26.2);Pain Pain - Right/Left: Left Pain - part of body: Knee     Time: 1110-1135 PT Time Calculation (min) (ACUTE ONLY): 25 min  Charges:  $Gait Training: 8-22 mins $Therapeutic Activity: 8-22 mins                     Julaine Fusi PTA 01/28/20, 12:44 PM

## 2020-01-28 NOTE — Progress Notes (Signed)
°  Subjective: 2 Days Post-Op Procedure(s) (LRB): TOTAL KNEE ARTHROPLASTY (Left) Patient reports pain as severe.   Patient states that she has been having to wait on her pain medications after requesting them and that her pain is significantly increased from yesterday. Patient is otherwise well. Plan is to go Home after hospital stay. Negative for chest pain and shortness of breath Fever: no Gastrointestinal: negative for nausea and vomiting.   Patient has not had a bowel movement.  Objective: Vital signs in last 24 hours: Temp:  [97.6 F (36.4 C)-99 F (37.2 C)] 98.8 F (37.1 C) (06/05 0810) Pulse Rate:  [92-104] 104 (06/05 0810) Resp:  [16-18] 18 (06/05 0810) BP: (140-147)/(75-92) 140/75 (06/05 0810) SpO2:  [96 %-100 %] 96 % (06/05 0810)  Intake/Output from previous day:  Intake/Output Summary (Last 24 hours) at 01/28/2020 1432 Last data filed at 01/28/2020 1416 Gross per 24 hour  Intake 656 ml  Output --  Net 656 ml    Intake/Output this shift: Total I/O In: 360 [P.O.:360] Out: -   Labs: Recent Labs    01/27/20 0429 01/28/20 0436  HGB 11.4* 11.2*   Recent Labs    01/27/20 0429 01/28/20 0436  WBC 10.5 13.1*  RBC 3.77* 3.67*  HCT 35.0* 33.7*  PLT 198 188   Recent Labs    01/27/20 0429 01/28/20 0436  NA 138 136  K 3.8 4.4  CL 104 104  CO2 27 25  BUN 15 15  CREATININE 0.86 0.64  GLUCOSE 138* 155*  CALCIUM 8.7* 8.8*   No results for input(s): LABPT, INR in the last 72 hours.   EXAM General - Patient is Alert, Appropriate and Oriented Extremity - Neurovascular intact Dorsiflexion/Plantar flexion intact Compartment soft Dressing/Incision - Honeycomb dressing with significant sanguinous drainage over the distal half Motor Function - intact, moving foot and toes well on exam.     Assessment/Plan: 2 Days Post-Op Procedure(s) (LRB): TOTAL KNEE ARTHROPLASTY (Left) Active Problems:   Status post total knee replacement using cement, left  Estimated  body mass index is 49.6 kg/m as calculated from the following:   Height as of this encounter: 5\' 3"  (1.6 m).   Weight as of this encounter: 127 kg. Advance diet Up with therapy Plan for discharge tomorrow after satisfactory pain control has been achieved and bowel movement has occurred.  Fresh honeycomb dressing applied.  Polar Care reapplied.  DVT Prophylaxis - Eloquis Weight-Bearing as tolerated to left leg  Cassell Smiles, PA-C [facility] 01/28/2020, 2:32 PM

## 2020-01-28 NOTE — Progress Notes (Signed)
Patient resting quietly. Call bell and phone within reach.

## 2020-01-29 LAB — BASIC METABOLIC PANEL
Anion gap: 6 (ref 5–15)
BUN: 11 mg/dL (ref 8–23)
CO2: 28 mmol/L (ref 22–32)
Calcium: 8.7 mg/dL — ABNORMAL LOW (ref 8.9–10.3)
Chloride: 101 mmol/L (ref 98–111)
Creatinine, Ser: 0.69 mg/dL (ref 0.44–1.00)
GFR calc Af Amer: >60 mL/min (ref >60)
GFR calc non Af Amer: >60 mL/min (ref >60)
Glucose, Bld: 134 mg/dL — ABNORMAL HIGH (ref 70–99)
Potassium: 4.7 mmol/L (ref 3.5–5.1)
Sodium: 135 mmol/L (ref 135–145)

## 2020-01-29 NOTE — Progress Notes (Signed)
Patient is being discharged home this afternoon. Patient's daughter will come to transfer. NT will help patient get dressed and pack belongings. IV removed. DC & Rx instructions given and patient acknowledged understanding.

## 2020-01-29 NOTE — Progress Notes (Signed)
  Subjective: 3 Days Post-Op Procedure(s) (LRB): TOTAL KNEE ARTHROPLASTY (Left) Patient reports pain as well-controlled.   Patient is well, and has had no acute complaints or problems Plan is to go Home after hospital stay. Negative for chest pain and shortness of breath Fever: no Gastrointestinal: negative for nausea and vomiting.   Patient has not had a bowel movement. She has been passing gas.   Objective: Vital signs in last 24 hours: Temp:  [98.2 F (36.8 C)-98.7 F (37.1 C)] 98.7 F (37.1 C) (06/06 0916) Pulse Rate:  [106-109] 106 (06/06 0916) Resp:  [16-18] 18 (06/06 0916) BP: (120-125)/(68-73) 120/68 (06/06 0916) SpO2:  [96 %-100 %] 100 % (06/06 0916)  Intake/Output from previous day:  Intake/Output Summary (Last 24 hours) at 01/29/2020 1234 Last data filed at 01/28/2020 1416 Gross per 24 hour  Intake 120 ml  Output --  Net 120 ml    Intake/Output this shift: No intake/output data recorded.  Labs: Recent Labs    01/27/20 0429 01/28/20 0436  HGB 11.4* 11.2*   Recent Labs    01/27/20 0429 01/28/20 0436  WBC 10.5 13.1*  RBC 3.77* 3.67*  HCT 35.0* 33.7*  PLT 198 188   Recent Labs    01/28/20 0436 01/29/20 0519  NA 136 135  K 4.4 4.7  CL 104 101  CO2 25 28  BUN 15 11  CREATININE 0.64 0.69  GLUCOSE 155* 134*  CALCIUM 8.8* 8.7*   No results for input(s): LABPT, INR in the last 72 hours.   EXAM General - Patient is Alert, Appropriate and Oriented Extremity - Neurovascular intact Dorsiflexion/Plantar flexion intact Compartment soft Dressing/Incision -clean, dry, no drainage Motor Function - intact, moving foot and toes well on exam.  Cardiovascular- Regular rate and rhythm, no murmurs/rubs/gallops Respiratory- Lungs clear to auscultation bilaterally Gastrointestinal- soft and nontender   Assessment/Plan: 3 Days Post-Op Procedure(s) (LRB): TOTAL KNEE ARTHROPLASTY (Left) Active Problems:   Status post total knee replacement using cement,  left  Estimated body mass index is 49.6 kg/m as calculated from the following:   Height as of this encounter: 5\' 3"  (1.6 m).   Weight as of this encounter: 127 kg. Advance diet Up with therapy Discharge home with home health    DVT Prophylaxis - Lovenox, Ted hose and foot pumps Weight-Bearing as tolerated to left leg  Cassell Smiles, PA-C [facility] Orthopa[facility] Surgery 01/29/2020, 12:34 PM

## 2020-01-29 NOTE — Progress Notes (Signed)
Physical Therapy Treatment Patient Details Name: Holly Henderson MRN: [id number] DOB: July 19, 1956 Today's Date: 01/29/2020    History of Present Illness 64 y/o female s/p L TKA 6/3.    PT Comments    Pt is making improved progress towards goals with ability to ambulate in hallway this date. Improved technique, however still reports significant pain, despite receiving pain meds prior to PT session. Good endurance with there-ex. Able to ambulate to bathroom. Pt hopeful to dc home this date. Will continue to progress.   Follow Up Recommendations  Home health PT     Equipment Recommendations  None recommended by PT    Recommendations for Other Services       Precautions / Restrictions Precautions Precautions: Fall;Knee Precaution Booklet Issued: Yes (comment) Restrictions Weight Bearing Restrictions: Yes LLE Weight Bearing: Weight bearing as tolerated    Mobility  Bed Mobility Overal bed mobility: Independent             General bed mobility comments: demonstrates safe technique from supine->sit. Uses hands to guide L knee  Transfers Overall transfer level: Needs assistance Equipment used: Rolling walker (2 wheeled) Transfers: Sit to/from Stand Sit to Stand: Supervision         General transfer comment: safe technique with upright posture.  Ambulation/Gait Ambulation/Gait assistance: Supervision Gait Distance (Feet): 175 Feet Assistive device: Rolling walker (2 wheeled) Gait Pattern/deviations: Step-through pattern;Antalgic Gait velocity: decreased   General Gait Details: ambulated in hallway with stiff and antalgic posture. Heavy B UE support on RW. Fatigues with exertion.   Stairs             Wheelchair Mobility    Modified Rankin (Stroke Patients Only)       Balance Overall balance assessment: Modified Independent                                          Cognition Arousal/Alertness: Awake/alert Behavior During  Therapy: WFL for tasks assessed/performed Overall Cognitive Status: Within Functional Limits for tasks assessed                                        Exercises Total Joint Exercises Goniometric ROM: L knee AAROM: 0-81 degrees Other Exercises Other Exercises: Supine ther-ex performed on L LE including AP, quad sets, glut sets, SLRs, and SAQ. All ther-ex performed x 10-15 reps on L LE. Other Exercises: ambulated to bathroom with cga and safe technique. Prefers foot propped on trash can. Supervision for transfers on/off.    General Comments        Pertinent Vitals/Pain Pain Assessment: 0-10 Pain Score: 6  Pain Location: L knee, increases with all activity Pain Descriptors / Indicators: Discomfort;Heaviness;Tightness;Burning Pain Intervention(s): Limited activity within patient's tolerance;Premedicated before session;Ice applied    Home Living                      Prior Function            PT Goals (current goals can now be found in the care plan section) Acute Rehab PT Goals Patient Stated Goal: go home PT Goal Formulation: With patient Time For Goal Achievement: 02/09/20 Potential to Achieve Goals: Good Progress towards PT goals: Progressing toward goals    Frequency    BID  PT Plan Current plan remains appropriate    Co-evaluation              AM-PAC PT "6 Clicks" Mobility   Outcome Measure  Help needed turning from your back to your side while in a flat bed without using bedrails?: None Help needed moving from lying on your back to sitting on the side of a flat bed without using bedrails?: None Help needed moving to and from a bed to a chair (including a wheelchair)?: None Help needed standing up from a chair using your arms (e.g., wheelchair or bedside chair)?: None Help needed to walk in hospital room?: None Help needed climbing 3-5 steps with a railing? : A Little 6 Click Score: 23    End of Session Equipment Utilized  During Treatment: Gait belt Activity Tolerance: Patient limited by pain Patient left: in chair;with chair alarm set Nurse Communication: Mobility status PT Visit Diagnosis: Muscle weakness (generalized) (M62.81);Difficulty in walking, not elsewhere classified (R26.2);Pain Pain - Right/Left: Left Pain - part of body: Knee     Time: 0812-0900 PT Time Calculation (min) (ACUTE ONLY): 48 min  Charges:  $Gait Training: 8-22 mins $Therapeutic Exercise: 8-22 mins $Therapeutic Activity: 8-22 mins                     Greggory Stallion, PT, DPT [phone]    Holly Henderson 01/29/2020, 9:55 AM

## 2020-01-29 NOTE — Plan of Care (Signed)
  Problem: Education: Goal: Knowledge of General Education information will improve Description: Including pain rating scale, medication(s)/side effects and non-pharmacologic comfort measures Outcome: Progressing   Problem: Clinical Measurements: Goal: Ability to maintain clinical measurements within normal limits will improve Outcome: Progressing Goal: Will remain free from infection Outcome: Progressing Goal: Diagnostic test results will improve Outcome: Progressing Goal: Respiratory complications will improve Outcome: Progressing   Problem: Coping: Goal: Level of anxiety will decrease Outcome: Progressing   Problem: Pain Managment: Goal: General experience of comfort will improve Outcome: Progressing   Problem: Safety: Goal: Ability to remain free from injury will improve Outcome: Progressing

## 2020-02-03 DIAGNOSIS — M84375A Stress fracture, left foot, initial encounter for fracture: Secondary | ICD-10-CM | POA: Insufficient documentation

## 2020-09-13 ENCOUNTER — Other Ambulatory Visit: Payer: Self-pay | Admitting: Obstetrics and Gynecology

## 2020-09-13 DIAGNOSIS — Z1231 Encounter for screening mammogram for malignant neoplasm of breast: Secondary | ICD-10-CM

## 2020-10-11 ENCOUNTER — Other Ambulatory Visit: Payer: Self-pay

## 2020-10-11 ENCOUNTER — Ambulatory Visit
Admission: RE | Admit: 2020-10-11 | Discharge: 2020-10-11 | Disposition: A | Discharge status: Home / Self Care | Payer: BC Managed Care – PPO | Source: Ambulatory Visit | Attending: Obstetrics and Gynecology | Admitting: Obstetrics and Gynecology

## 2020-10-11 DIAGNOSIS — Z1231 Encounter for screening mammogram for malignant neoplasm of breast: Secondary | ICD-10-CM | POA: Insufficient documentation

## 2020-10-18 ENCOUNTER — Other Ambulatory Visit: Payer: Self-pay | Admitting: Obstetrics and Gynecology

## 2020-10-18 DIAGNOSIS — N6489 Other specified disorders of breast: Secondary | ICD-10-CM

## 2020-10-18 DIAGNOSIS — R928 Other abnormal and inconclusive findings on diagnostic imaging of breast: Secondary | ICD-10-CM

## 2020-10-29 ENCOUNTER — Ambulatory Visit
Admission: RE | Admit: 2020-10-29 | Discharge: 2020-10-29 | Disposition: A | Discharge status: Home / Self Care | Payer: BC Managed Care – PPO | Source: Ambulatory Visit | Attending: Obstetrics and Gynecology | Admitting: Obstetrics and Gynecology

## 2020-10-29 ENCOUNTER — Other Ambulatory Visit: Payer: Self-pay

## 2020-10-29 DIAGNOSIS — N6489 Other specified disorders of breast: Secondary | ICD-10-CM

## 2020-10-29 DIAGNOSIS — R928 Other abnormal and inconclusive findings on diagnostic imaging of breast: Secondary | ICD-10-CM

## 2020-11-01 ENCOUNTER — Other Ambulatory Visit: Payer: Self-pay | Admitting: Obstetrics and Gynecology

## 2020-11-01 DIAGNOSIS — N6489 Other specified disorders of breast: Secondary | ICD-10-CM

## 2020-11-01 DIAGNOSIS — R928 Other abnormal and inconclusive findings on diagnostic imaging of breast: Secondary | ICD-10-CM

## 2020-11-05 ENCOUNTER — Ambulatory Visit
Admission: RE | Admit: 2020-11-05 | Discharge: 2020-11-05 | Disposition: A | Discharge status: Home / Self Care | Payer: BC Managed Care – PPO | Source: Ambulatory Visit | Attending: Obstetrics and Gynecology | Admitting: Obstetrics and Gynecology

## 2020-11-05 ENCOUNTER — Other Ambulatory Visit: Payer: Self-pay

## 2020-11-05 DIAGNOSIS — N6489 Other specified disorders of breast: Secondary | ICD-10-CM

## 2020-11-05 DIAGNOSIS — R928 Other abnormal and inconclusive findings on diagnostic imaging of breast: Secondary | ICD-10-CM | POA: Diagnosis present

## 2020-11-05 HISTORY — PX: BREAST BIOPSY: SHX20

## 2020-11-07 ENCOUNTER — Encounter: Payer: Self-pay | Admitting: *Deleted

## 2020-11-07 DIAGNOSIS — C50912 Malignant neoplasm of unspecified site of left female breast: Secondary | ICD-10-CM

## 2020-11-07 NOTE — Progress Notes (Signed)
Notified by Stacie Acres, RN that patient was aware of her new diagnosis of invasive lobular carcinoma of the left breast.  Called patient to establish navigation services.  Patient would like to see med/onc in [place], but is good with seeing a Psychologist, sport and exercise in [place].  I have scheduled the patient to see Dr. Lysle Pearl on Tuesday November 13, 2020 @ 2:45, and Dr. Mike Gip at 10:30.  She can pick up her educational material at any time or I can give to her at her appointment with Dr. Lysle Pearl.

## 2020-11-12 LAB — SURGICAL PATHOLOGY

## 2020-11-12 NOTE — Progress Notes (Signed)
[facility]  [address], [address] [place], [place] [address] Phone: [phone]  Fax: [phone]   Clinic Day:  11/13/2020  Referring physician: Benjaman Kindler, MD  Chief Complaint: Holly Henderson is a 65 y.o. female with left breast cancer who is referred in consultation by Dr. Benjaman Kindler for assessment and management.   HPI:   Patient undergoes yearly mammograms.  Bilateral diagnostic mammogram on 06/23/2019 revealed no mammographic evidence of malignancy. There was resolution of a previously seen asymmetry in the outer left breast.  Bilateral screening mammogram on 10/11/2020 revealed asymmetry with distortion in the left breast.  Left diagnostic mammogram on 10/29/2020 revealed focal asymmetry in the upper-outer quadrant of the LEFT breast, with possible subtle associated architectural distortion. Stereotactic biopsy with 3D tomosynthesis guidance was recommended.  Left breast biopsy on 11/05/2020 revealed a 6 mm, grade I, invasive lobular carcinoma, classic type. ER was positive (> 90%), PR positive (> 90%), and Her2/neu negative (1+).  She is scheduled to see Dr Lysle Pearl today at 2:45 PM.  Symptomatically, she feels "good." She has nasal congestion and a cough due to allergies. She gets short of breath on exertion. Her left shoulder is stiff sometimes.  The patient and her provider are unable to feel the mass in her breast. She is sore s/p biopsy.  She denies fevers, sweats, headaches, changes in vision, sore throat, chest pain, palpitations, nausea, vomiting, diarrhea, reflux, urinary symptoms, skin changes, numbness, weakness, balance or coordination problems, and bleeding of any kind.  She was diagnosed with diabetes on 10/08/2020. She is on Metformin and has drastically changed her diet. She is losing weight intentionally. She was interested in bariatric surgery but is unsure about what to do since she has been diagnosed with cancer.  She  had her first period at 40-57 years old. She had an endometrial ablation in her 52s (around 2002) so is not sure when she went through menopause. She has 6 living children and had 2 miscarriages. She breast fed all of her children for about 1 year except for one. She was on birth control for a year after she had her first son. She was never on hormone replacement after menopause.    The patient had a colonoscopy in 05/2019. Per patient, "everything was good."  She had her left knee replaced. She agrees to a breast MRI.  Her mother had breast cancer (mid 32s) and dementia. She passed away. Her paternal grandfather had colon cancer. Her grandmother had lung cancer.   Past Medical History:  Diagnosis Date  . Hypertension   . Malignant neoplasm of upper-outer quadrant of left female breast ([facility]) 11/13/2020    Past Surgical History:  Procedure Laterality Date  . BREAST BIOPSY Left 11/05/2020   stereo bx, x-clip, path pending   . CHOLECYSTECTOMY    . FRACTURE SURGERY    . TOTAL KNEE ARTHROPLASTY Left 01/26/2020   Procedure: TOTAL KNEE ARTHROPLASTY;  Surgeon: Corky Mull, MD;  Location: [facility] ORS;  Service: Orthopedics;  Laterality: Left;    Family History  Problem Relation Age of Onset  . Breast cancer Mother 85    Social History:  reports that she has never smoked. She has never used smokeless tobacco. She reports current alcohol use. She reports that she does not use drugs. She has never smoked. She has not had any alcohol since she was diagnosed with diabetes. She denies exposure to radiation or toxins. She has 6 children. She previously lived in [place].  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 The patient is alone today.  Allergies:  Allergies  Allergen Reactions  . Latex Other (See Comments)    irritation    Current Medications: Current Outpatient Medications  Medication Sig Dispense Refill  . ergocalciferol (VITAMIN D2) 1.25 MG (50000 UT) capsule Take by mouth.    . losartan  (COZAAR) 50 MG tablet Take 50 mg by mouth daily.    . metFORMIN (GLUCOPHAGE) 1000 MG tablet Take by mouth.    . Multiple Minerals-Vitamins (CALCIUM-MAGNESIUM-ZINC-D3) TABS Take 1 tablet by mouth in the morning and at bedtime.    Marland Kitchen spironolactone (ALDACTONE) 25 MG tablet Take 25 mg by mouth daily.     No current facility-administered medications for this visit.    Review of Systems  Constitutional: Negative for chills, diaphoresis, fever, malaise/fatigue and weight loss (intentional).       Feels "good."  HENT: Positive for congestion (allergies). Negative for ear discharge, ear pain, hearing loss, nosebleeds, sinus pain, sore throat and tinnitus.   Eyes: Negative for blurred vision.  Respiratory: Positive for cough (allergies) and shortness of breath (with exertion). Negative for hemoptysis and sputum production.   Cardiovascular: Negative for chest pain, palpitations and leg swelling.  Gastrointestinal: Negative for abdominal pain, blood in stool, constipation, diarrhea, heartburn, melena, nausea and vomiting.  Genitourinary: Negative for dysuria, frequency, hematuria and urgency.  Musculoskeletal: Positive for joint pain (left shoulder stiffness, occasional). Negative for back pain, myalgias and neck pain.       Soreness s/p biopsy  Skin: Negative for itching and rash.  Neurological: Negative for dizziness, tingling, sensory change, weakness and headaches.  Endo/Heme/Allergies: Positive for environmental allergies. Does not bruise/bleed easily.       Diabetes, recently diagnosed  Psychiatric/Behavioral: Negative for depression and memory loss. The patient is not nervous/anxious and does not have insomnia.   All other systems reviewed and are negative.  Performance status (ECOG): 1  Vitals Blood pressure (!) 156/83, pulse 76, resp. rate 18, weight 275 lb 9.2 oz (125 kg), SpO2 100 %.   Physical Exam Vitals and nursing note reviewed.  Constitutional:      General: She is not in acute  distress.    Appearance: She is not diaphoretic.  HENT:     Head: Normocephalic and atraumatic.     Comments: Curly brown hair.    Mouth/Throat:     Mouth: Mucous membranes are moist.     Pharynx: Oropharynx is clear.  Eyes:     General: No scleral icterus.    Extraocular Movements: Extraocular movements intact.     Conjunctiva/sclera: Conjunctivae normal.     Pupils: Pupils are equal, round, and reactive to light.     Comments: Brown eyes; contacts.  Cardiovascular:     Rate and Rhythm: Normal rate and regular rhythm.     Heart sounds: Normal heart sounds. No murmur heard.   Pulmonary:     Effort: Pulmonary effort is normal. No respiratory distress.     Breath sounds: Normal breath sounds. No wheezing or rales.  Chest:     Chest wall: No tenderness.  Breasts:     Right: No swelling, bleeding, mass, skin change, tenderness, axillary adenopathy or supraclavicular adenopathy.     Left: Skin change (fibrocystic changes inferiorly) and tenderness present. No swelling, bleeding, axillary adenopathy or supraclavicular adenopathy.    Abdominal:     General: Bowel sounds are normal. There is no distension.     Palpations: Abdomen is soft.  There is no mass.     Tenderness: There is no abdominal tenderness. There is no guarding or rebound.  Musculoskeletal:        General: No swelling or tenderness. Normal range of motion.     Cervical back: Normal range of motion and neck supple.     Right lower leg: No edema.     Left lower leg: No edema.  Lymphadenopathy:     Head:     Right side of head: No preauricular, posterior auricular or occipital adenopathy.     Left side of head: No preauricular, posterior auricular or occipital adenopathy.     Cervical: No cervical adenopathy.     Upper Body:     Right upper body: No supraclavicular or axillary adenopathy.     Left upper body: No supraclavicular or axillary adenopathy.     Lower Body: No right inguinal adenopathy. No left inguinal  adenopathy.  Skin:    General: Skin is warm and dry.  Neurological:     Mental Status: She is alert and oriented to person, place, and time.  Psychiatric:        Behavior: Behavior normal.        Thought Content: Thought content normal.        Judgment: Judgment normal.    No visits with results within 3 Day(s) from this visit.  Latest known visit with results is:  Hospital Outpatient Visit on 11/05/2020  Component Date Value Ref Range Status  . SURGICAL PATHOLOGY 11/05/2020    Final-Edited                   Value:SURGICAL PATHOLOGY CASE: [id number] PATIENT: [facility] Surgical Pathology Report  Specimen Submitted: A. Breast, left, upper outer quadrant; biopsy  Clinical History: Focal asymmetry with possible distortion in left breast upper outer quadrant, carcinoma vs PASH vs stromal fibrosis. Post-biopsy mammograms show X-shaped biopsy clip positioned 8 mm posterior-medial to the biopsy site.  DIAGNOSIS: A. BREAST, LEFT, UPPER OUTER QUADRANT; STEREOTACTIC-GUIDED CORE BIOPSY: - INVASIVE LOBULAR CARCINOMA, CLASSIC TYPE.  Size of invasive carcinoma: at least 6 mm based on this sample; invasive carcinoma is in multiple core fragments Histologic grade of invasive carcinoma: Grade 1                      Glandular/tubular differentiation score: 3                      Nuclear pleomorphism score: 1                      Mitotic rate score: 1                      Total score: 5 Ductal carcinoma in situ: Not identified Lobular carcinoma in situ: Present Lymphov                         ascular invasion: Not identified  ER/PR/HER2: Immunohistochemistry will be performed on block A2, with reflex to [place] for HER2 2+. The results will be reported in an addendum.  Comment: The definitive grade will be assigned on the excisional specimen.  GROSS DESCRIPTION: A. Labeled: Left breast stereo biopsy asymmetry upper outer quadrant Received: in a formalin-filled [place]  collection device Specimen radiograph image(s) available for review Time/Date in fixative: Collected at 8:56 AM on 11/05/2020 and placed in formalin 8:59 AM on 11/05/2020 Cold  ischemic time: Less than 5 minutes Total fixation time: 8 hours Core pieces: Multiple Measurement: Aggregate, 7.2 x 1.5 x 0.4 cm Description / comments: Received are cores and fragments of yellow fibrofatty tissue.  A distinct diagram indicating the location of calcifications is not received on the container or with the requisition. Inked: Green Entirely submitted in cassette(s):  1 - sections A and B 2 - sections C and D  3 - sections E and F  4 - sections G and H  5 - sections I, J, and K   RB 11/05/2020   Final Diagnosis performed by Bryan Lemma, MD.  Electronically signed  11/06/2020 12:47:51PM  The electronic signature indicates that the named Attending Pathologist  has evaluated the specimen  Technical component performed at [facility], [address] [place],  [place] [address] Lab: [phone] Dir: Rush Farmer, MD, MMM  Professional component performed at [facility], [facility], [place], [place], [place] [address] Lab: [phone]  Dir: Dellia Nims. Rubinas, MD   ADDENDUM:  CASE SUMMARY: BREAST BIOMARKER TESTS  TEST(S) PERFORMED:  Estrogen Receptor (ER) Status: POSITIVE      Percentage of cells with nuclear positivity: Greater than 90%      Average intensity of staining: Strong   Progesterone Receptor (PgR) Status: POSITIVE      Percentageof cells with nuclear positivity: Greater than 90%      Average intensity of staining: Strong   HER2 (by immunohistochemistry): NEGATIVE (Score 1+)   Cold Ischemia and Fixation Times: Meet requirements specified in latest  version of the ASCO/CAP guidelines  Testing Performed on Block Number(s): A2   METHODS  Fixative: Formalin  Estrogen Receptor: FDA cleared ([place]) Primary Antibody: SP1  Progesterone Receptor:  FDA cleared ([place]) Primary Antibody: 1E2  HER2 (by IHC): FDA cleared ([place]) Primary Antibody: 4B5 ([facility])  Immunohistochemistry controls worked appropriately. Slides were prepared  by [facility], [place], [place], and interpreted by Dr. Dicie Beam.   This test was developed and its performance characteristics determined  by [facility]. It has not been cleared or approved by the [place] Food and Drug  Administration. The FDA does not require this test to go through  premarket FDA review. This test is used for clinical purposes. It should  not be regarded as investigational or for research. This laboratory is  certified under the Clinical Laboratory Improvement Amendments (CLIA) as  qualified to perform high complexity clinical laboratory testing.   Addendum #1 performed by Bryan Lemma, MD.  Electronically signed  11/12/2020 1:57:40PM  The electronic signature indicates that the named Attending Pathologist  has evaluated the specimen  Technical component performed at [facility], [address] [place],  [place] [address] Lab: [phone] Dir: Rush Farmer, MD, MMM  Professional component performed at [facility], [facility], [place], [place],  [address] Lab: [phone]  Dir: Dellia Nims. Reuel Derby, MD       Assessment:  Holly Henderson is a 65 y.o. female with left breast cancer s/p biopsy on 11/05/2020.  Pathology revealed a 6 mm, grade I, invasive lobular carcinoma, classic type. ER was positive (> 90%), PR positive (> 90%), and Her2/neu negative (1+).  Bilateral screening mammogram on 10/11/2020 revealed asymmetry with distortion in the left breast.  Left diagnostic mammogram on 10/29/2020 revealed focal asymmetry in the upper-outer quadrant of the LEFT breast, with possible subtle associated architectural distortion.  Symptomatically, she feels "good." She has shortness of breath on exertion.  She is intentionally losing weight.  Exam  reveals post biopsy  changes.  Plan: 1.   Labs today:  CBC with diff, CMP, CA27.29. 2.   Invasive lobular left breast cancer  Review diagnostic mammogram.  Review pathology.   Size of lesion is unclear.  Tumor is grade I and ER/PR+ and Her2/neu-  Exam reveals post biopsy changes and no palpable adenopathy.  Discuss plan for bilateral breast MRI given lobular features.  Discuss plans for surgery (probable lumpectomy and SLN biopsy) followed by radiation.  Anticipate endocrine therapy (tamoxifen vs AI).   Potential side effects reviewed.  Consider Oncotype Dx based on size of lesion (suspect low recurrence score).  Multiple questions asked and answered. 3.   Bilateral breast MRI on 11/16/2020. 4.   Tumor board on 11/19/2020. 5.   RTC after surgery for MD assessment and review of pathology.  I discussed the assessment and treatment plan with the patient.  The patient was provided an opportunity to ask questions and all were answered.  The patient agreed with the plan and demonstrated an understanding of the instructions.  The patient was advised to call back if the symptoms worsen or if the condition fails to improve as anticipated.  I provided 42 minutes of face-to-face time during this this encounter and > 50% was spent counseling as documented under my assessment and plan. An additional 15+ minutes were spent reviewing her chart ([place]) including notes, labs, and imaging studies.    Melissa C. Mike Gip, MD, PhD    11/13/2020, 12:08 PM  I, Mirian Mo Tufford, am acting as Education administrator for [employer]. Mike Gip, MD, PhD.  I, Melissa C. Mike Gip, MD, have reviewed the above documentation for accuracy and completeness, and I agree with the above.

## 2020-11-13 ENCOUNTER — Encounter: Payer: Self-pay | Admitting: Hematology and Oncology

## 2020-11-13 ENCOUNTER — Other Ambulatory Visit: Payer: Self-pay

## 2020-11-13 ENCOUNTER — Ambulatory Visit: Payer: Self-pay | Admitting: Surgery

## 2020-11-13 ENCOUNTER — Inpatient Hospital Stay: Payer: BC Managed Care – PPO | Attending: Hematology and Oncology | Admitting: Hematology and Oncology

## 2020-11-13 ENCOUNTER — Inpatient Hospital Stay: Payer: BC Managed Care – PPO

## 2020-11-13 VITALS — BP 156/83 | HR 76 | Resp 18 | Wt 275.6 lb

## 2020-11-13 DIAGNOSIS — E119 Type 2 diabetes mellitus without complications: Secondary | ICD-10-CM | POA: Insufficient documentation

## 2020-11-13 DIAGNOSIS — C50412 Malignant neoplasm of upper-outer quadrant of left female breast: Secondary | ICD-10-CM

## 2020-11-13 DIAGNOSIS — Z170 Estrogen receptor positive status [ER+]: Secondary | ICD-10-CM | POA: Diagnosis not present

## 2020-11-13 DIAGNOSIS — Z801 Family history of malignant neoplasm of trachea, bronchus and lung: Secondary | ICD-10-CM

## 2020-11-13 DIAGNOSIS — R0981 Nasal congestion: Secondary | ICD-10-CM | POA: Diagnosis not present

## 2020-11-13 DIAGNOSIS — Z803 Family history of malignant neoplasm of breast: Secondary | ICD-10-CM | POA: Insufficient documentation

## 2020-11-13 DIAGNOSIS — R059 Cough, unspecified: Secondary | ICD-10-CM | POA: Insufficient documentation

## 2020-11-13 DIAGNOSIS — M25612 Stiffness of left shoulder, not elsewhere classified: Secondary | ICD-10-CM | POA: Diagnosis not present

## 2020-11-13 DIAGNOSIS — I10 Essential (primary) hypertension: Secondary | ICD-10-CM

## 2020-11-13 DIAGNOSIS — Z9884 Bariatric surgery status: Secondary | ICD-10-CM

## 2020-11-13 DIAGNOSIS — Z818 Family history of other mental and behavioral disorders: Secondary | ICD-10-CM | POA: Diagnosis not present

## 2020-11-13 DIAGNOSIS — Z800 Family history of malignant neoplasm of digestive organs: Secondary | ICD-10-CM | POA: Insufficient documentation

## 2020-11-13 DIAGNOSIS — Z9049 Acquired absence of other specified parts of digestive tract: Secondary | ICD-10-CM

## 2020-11-13 DIAGNOSIS — Z79899 Other long term (current) drug therapy: Secondary | ICD-10-CM | POA: Diagnosis not present

## 2020-11-13 DIAGNOSIS — R0602 Shortness of breath: Secondary | ICD-10-CM | POA: Insufficient documentation

## 2020-11-13 HISTORY — DX: Malignant neoplasm of upper-outer quadrant of left female breast: C50.412

## 2020-11-13 LAB — COMPREHENSIVE METABOLIC PANEL
ALT: 32 U/L (ref 0–44)
AST: 24 U/L (ref 15–41)
Albumin: 3.9 g/dL (ref 3.5–5.0)
Alkaline Phosphatase: 66 U/L (ref 38–126)
Anion gap: 8 (ref 5–15)
BUN: 15 mg/dL (ref 8–23)
CO2: 23 mmol/L (ref 22–32)
Calcium: 9.3 mg/dL (ref 8.9–10.3)
Chloride: 107 mmol/L (ref 98–111)
Creatinine, Ser: 0.64 mg/dL (ref 0.44–1.00)
GFR, Estimated: >60 mL/min (ref >60)
Glucose, Bld: 120 mg/dL — ABNORMAL HIGH (ref 70–99)
Potassium: 4.1 mmol/L (ref 3.5–5.1)
Sodium: 138 mmol/L (ref 135–145)
Total Bilirubin: 0.9 mg/dL (ref 0.3–1.2)
Total Protein: 7.3 g/dL (ref 6.5–8.1)

## 2020-11-13 LAB — CBC WITH DIFFERENTIAL/PLATELET
Abs Immature Granulocytes: 0.03 10*3/uL (ref 0.00–0.07)
Basophils Absolute: 0 10*3/uL (ref 0.0–0.1)
Basophils Relative: 1 %
Eosinophils Absolute: 0.2 10*3/uL (ref 0.0–0.5)
Eosinophils Relative: 2 %
HCT: 40.4 % (ref 36.0–46.0)
Hemoglobin: 13.5 g/dL (ref 12.0–15.0)
Immature Granulocytes: 0 %
Lymphocytes Relative: 18 %
Lymphs Abs: 1.4 10*3/uL (ref 0.7–4.0)
MCH: 30 pg (ref 26.0–34.0)
MCHC: 33.4 g/dL (ref 30.0–36.0)
MCV: 89.8 fL (ref 80.0–100.0)
Monocytes Absolute: 0.7 10*3/uL (ref 0.1–1.0)
Monocytes Relative: 8 %
Neutro Abs: 5.5 10*3/uL (ref 1.7–7.7)
Neutrophils Relative %: 71 %
Platelets: 261 10*3/uL (ref 150–400)
RBC: 4.5 MIL/uL (ref 3.87–5.11)
RDW: 13.5 % (ref 11.5–15.5)
WBC: 7.8 10*3/uL (ref 4.0–10.5)
nRBC: 0 % (ref 0.0–0.2)

## 2020-11-13 NOTE — Progress Notes (Signed)
Patient is trying to prepare for bariatric surgery.

## 2020-11-13 NOTE — Patient Instructions (Signed)
  Please call for follow-up appointment 1 week after surgery.

## 2020-11-13 NOTE — H&P (Signed)
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 female who was referred for evaluation of above. Change was noted on last screening mammogram. Routine breast self exam performed and has not noticed any changes.  ROS as noted below.   Past Medical History:  has a past medical history of Abnormal Pap smear, Back pain (04/21/2012), Diabetes mellitus without complication (CMS-HCC) (3/71/0626), GERD (gastroesophageal reflux disease), History of cancer (11/06/2020), Hypertension, Primary osteoarthritis of left knee (01/01/2016), Primary osteoarthritis of one knee, left (01/01/2016), and Sleep apnea.  Past Surgical History:  has a past surgical history that includes Cholecystectomy (2003); wisdom teeth; Fracture surgery (1970); Endometrial ablation; Colonoscopy (06/25/2018); Extraction Teeth; and  Left TKA using all-cemented Biomet Vanguard system with a 65 mm PCR femur, a 71 mm tibial tray with a 10 mm anterior stabilized E-poly insert, and a 34 x 8.5 mm all-poly 3-pegged domed patella (Left, 01/26/2020).  Family History: family history includes Alcohol abuse in her maternal aunt, maternal uncle, mother, and paternal grandfather; Alzheimer's disease in her paternal grandmother; Aneurysm in her maternal grandfather; Aortic aneurysm in her maternal grandfather; Bipolar disorder in her maternal aunt; Breast cancer in her mother; Colon cancer in her paternal grandfather; Coronary Artery Disease (Blocked arteries around heart) in her father and maternal grandfather; Glaucoma in her maternal grandmother; Gout in her father and maternal grandfather; High blood pressure (Hypertension) in her father and mother; Hyperlipidemia (Elevated cholesterol) in her father; Lung cancer in her maternal grandmother; Obesity in her paternal grandmother and sister; Stroke in her maternal grandmother and mother.  Social History:  reports that she has never smoked. She has never used  smokeless tobacco. She reports previous alcohol use. She reports that she does not use drugs.  Current Medications: has a current medication list which includes the following prescription(s): glucose blood, blood glucose meter, ergocalciferol (vitamin d2), lancets, losartan, metformin, multivitamin, and spironolactone.  Allergies:  Allergies as of 11/13/2020 - Reviewed 11/13/2020  Allergen Reaction Noted  . Latex, natural rubber Other (See Comments) 09/27/2020    ROS:  A 15 point review of systems was performed and was negative except as noted in HPI   Objective:     BP (!) 155/80   Pulse 89   Ht 161.3 cm (5' 3.5")   Wt (!) 127.9 kg (282 lb)   BMI 49.17 kg/m   Constitutional :  alert, appears stated age, cooperative and no distress  Lymphatics/Throat:  no asymmetry, masses, or scars  Respiratory:  clear to auscultation bilaterally  Cardiovascular:  regular rate and rhythm  Gastrointestinal: soft, non-tender; bowel sounds normal; no masses,  no organomegaly.   Musculoskeletal: Steady gait and movement  Skin: Cool and moist  Psychiatric: Normal affect, non-agitated, not confused  Breast:  Chaperone present for exam.  breasts appear normal, no suspicious masses, no skin or nipple changes or axillary nodes.    LABS:  Reason for Addendum #1: Breast Biomarker Results   Specimen Submitted:  A. Breast, left,upper outer quadrant; biopsy   Clinical History: Focal asymmetry with possible distortion in left  breast upper outer quadrant, carcinoma vs PASH vs stromal fibrosis.  Post-biopsy mammograms show X-shaped biopsy clip positioned 8 mm  posterior-medial to the  biopsy site.      DIAGNOSIS:  A. BREAST, LEFT, UPPER OUTER QUADRANT; STEREOTACTIC-GUIDED CORE BIOPSY:  - INVASIVE LOBULAR CARCINOMA, CLASSIC TYPE.   Size of invasive carcinoma: at least 6 mm based on this sample; invasive  carcinoma is in multiple core fragments  Histologic grade of invasive carcinoma: Grade 1             Glandular/tubular differentiation score: 3            Nuclear pleomorphism score: 1            Mitotic rate score: 1            Total score: 5  Ductal carcinoma in situ: Not identified  Lobular carcinoma in situ: Present  Lymphovascular invasion: Not identified   ER/PR/HER2: Immunohistochemistry will be performed on block A2, with  reflex to [place] for HER2 2+. The results will be reported in an addendum.   Comment:  The definitive grade will be assigned on the excisional specimen.   GROSS DESCRIPTION:  A. Labeled: Left breast stereo biopsy asymmetry upper outer quadrant  Received: in a formalin-filled [place] collection device  Specimen radiograph image(s) available for review  Time/Date in fixative: Collected at 8:56 AM on 11/05/2020 and placed in  formalin 8:59 AM on 11/05/2020  Cold ischemic time: Less than 5 minutes  Total fixation time: 8 hours  Core pieces: Multiple  Measurement: Aggregate, 7.2 x 1.5 x 0.4 cm  Description / comments: Received are cores and fragments of yellow  fibrofatty tissue. A distinct diagram indicating the location of  calcifications is not received on the container or with the requisition.  Inked:Green  Entirely submitted in cassette(s):   1 - sections A and B  2 - sections C and D  3 - sections E and F  4 - sections G and H  5 - sections I, J, and K   RB 11/05/2020    Final Diagnosis performed by Bryan Lemma, MD.  Electronically signed  11/06/2020 12:47:51PM  The electronic signature indicates that the named Attending Pathologist  has evaluated the specimen  Technical component performed at [facility], [address] [place],  [place] [address] Lab: [phone] Dir: Rush Farmer, MD, MMM  Professional component performed at [facility], [facility], [place], [place], [place] [address] Lab: [phone]  Dir: Dellia Nims. Rubinas, MD   ADDENDUM:  CASE SUMMARY: BREAST  BIOMARKER TESTS  TEST(S) PERFORMED:  Estrogen Receptor (ER) Status: POSITIVE     Percentage of cells with nuclear positivity: Greater than 90%     Average intensity of staining: Strong   Progesterone Receptor (PgR) Status: POSITIVE     Percentage of cells with nuclear positivity: Greater than 90%     Average intensity of staining: Strong   HER2 (by immunohistochemistry): NEGATIVE (Score 1+)   Cold Ischemia and Fixation Times: Meet requirements specified in latest  version of the ASCO/CAP guidelines  Testing Performed on Block Number(s): A2   METHODS  Fixative: Formalin  Estrogen Receptor: FDA cleared ([place]) Primary Antibody: SP1  Progesterone Receptor: FDA cleared ([place]) Primary Antibody: 1E2  HER2 (by IHC): FDA cleared ([place]) Primary Antibody: 4B5 ([facility])  Immunohistochemistry controls worked appropriately. Slides were prepared  by [facility], [place], [place], and interpreted by Dr. Dicie Beam.   This test was developed and its performance characteristics determined  by [facility]. It has not been cleared or approved by the [place] Food and Drug  Administration. The FDA does not require this test to go through  premarket FDA review. This test is used for clinical purposes. It should  not be regarded as investigational or for research. This laboratory is  certified under the Clinical Laboratory Improvement Amendments (CLIA) as  qualified to  perform high complexity clinical laboratory testing.     Addendum #1 performed by Bryan Lemma, MD.  Electronically signed  11/12/2020 1:57:40PM  The electronic signature indicates that the named Attending Pathologist  has evaluated the specimen  Technical component performed at [facility], [address] [place],  [place] [address] Lab: [phone] Dir: Rush Farmer, MD, MMM  Professional component performed at [facility], [facility], [place], [place], [place] [address] Lab: [phone]   Dir: Dellia Nims. Rubinas, MD   RADS: CLINICAL DATA: Patient returns today to evaluate a possible LEFT  breast asymmetry with distortion in the LEFT breast.   EXAM:  DIGITAL DIAGNOSTIC UNILATERAL LEFT MAMMOGRAM WITH TOMOSYNTHESIS AND  CAD   TECHNIQUE:  Left digital diagnostic mammography and breast tomosynthesis was  performed. The images were evaluated with computer-aided detection.   COMPARISON: Previous exams including recent screening mammogram  dated 10/11/2020 and LEFT breast ultrasound dated 05/14/2017.   ACR Breast Density Category b: There are scattered areas of  fibroglandular density.   FINDINGS:  On today's additional diagnostic views, including spot compression  with 3D tomosynthesis, there is a persistent asymmetry in the  upper-outer quadrant of the LEFT breast, with possible subtle  associated architectural distortion, best demonstrated on cc views.   IMPRESSION:  Focal asymmetry in the upper-outer quadrant of the LEFT breast, with  possible subtle associated architectural distortion. This is a  suspicious finding for which stereotactic biopsy with 3D  tomosynthesis guidance is recommended.   RECOMMENDATION:  Stereotactic biopsy, with 3D tomosynthesis guidance, for the focal  asymmetry in the upper-outer quadrant of the LEFT breast, with  possible subtle associated architectural distortion, best seen on CC  views.   Ordering physician will be contacted and stereotactic biopsy will be  scheduled at patient's convenience.   I have discussed the findings and recommendations with the patient.  If applicable, a reminder letter will be sent to the patient  regarding the next appointment.   BI-RADS CATEGORY 4: Suspicious.    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 The risks of general anesthetic, if used, includes MI, CVA, sudden death or even reaction to anesthetic medications also discussed.  Typical post-op recovery time and possbility of activity restrictions were also discussed.  Alternatives include continued observation.  Benefits include possible symptom relief, pathologic evaluation, and/or curative excision.   The patient verbalized understanding and all questions were answered to the patient's satisfaction.  2. Patient has elected to proceed with surgical treatment. Procedure will be scheduled after MRI to assess occult lesions and lymph node status. RF tag, left side lumpectomy with SLNB afterwards if negative.

## 2020-11-14 ENCOUNTER — Other Ambulatory Visit: Payer: Self-pay | Admitting: Surgery

## 2020-11-14 DIAGNOSIS — C50012 Malignant neoplasm of nipple and areola, left female breast: Secondary | ICD-10-CM

## 2020-11-14 LAB — CANCER ANTIGEN 27.29: CA 27.29: 28.6 U/mL (ref 0.0–38.6)

## 2020-11-15 ENCOUNTER — Encounter: Payer: Self-pay | Admitting: *Deleted

## 2020-11-15 ENCOUNTER — Other Ambulatory Visit: Payer: Self-pay | Admitting: Surgery

## 2020-11-15 DIAGNOSIS — C50012 Malignant neoplasm of nipple and areola, left female breast: Secondary | ICD-10-CM

## 2020-11-16 ENCOUNTER — Other Ambulatory Visit: Payer: Self-pay

## 2020-11-16 ENCOUNTER — Ambulatory Visit
Admission: RE | Admit: 2020-11-16 | Discharge: 2020-11-16 | Disposition: A | Discharge status: Home / Self Care | Payer: BC Managed Care – PPO | Source: Ambulatory Visit | Attending: Hematology and Oncology | Admitting: Hematology and Oncology

## 2020-11-16 DIAGNOSIS — C50412 Malignant neoplasm of upper-outer quadrant of left female breast: Secondary | ICD-10-CM | POA: Insufficient documentation

## 2020-11-16 MED ORDER — GADOBUTROL 1 MMOL/ML IV SOLN
10.0000 mL | Freq: Once | INTRAVENOUS | Status: AC | PRN
  Administered 2020-11-16: 10 mL via INTRAVENOUS

## 2020-11-19 ENCOUNTER — Other Ambulatory Visit: Payer: Self-pay

## 2020-11-19 ENCOUNTER — Other Ambulatory Visit: Payer: Self-pay | Admitting: Hematology and Oncology

## 2020-11-19 ENCOUNTER — Telehealth: Payer: Self-pay | Admitting: *Deleted

## 2020-11-19 ENCOUNTER — Encounter
Admission: RE | Admit: 2020-11-19 | Discharge: 2020-11-19 | Disposition: A | Discharge status: Home / Self Care | Payer: BC Managed Care – PPO | Source: Ambulatory Visit | Attending: Surgery | Admitting: Surgery

## 2020-11-19 DIAGNOSIS — R928 Other abnormal and inconclusive findings on diagnostic imaging of breast: Secondary | ICD-10-CM

## 2020-11-19 HISTORY — DX: Type 2 diabetes mellitus without complications: E11.9

## 2020-11-19 NOTE — Telephone Encounter (Signed)
Spoke with Judson Roch at [facility] [place]- apts obtained for bx   Contacted the patient. Arranged for MRI guided biopsy x 3 of left breast at [facility] in [place]. Apt given to patient for 11/29/20. Patient instructed not to eat/drink anything 4 hours prior the procedure. She was instructed to avoid lotions and powders. Location: [address] in [place]. She will get a reminder call a few days prior to the biopsy from the department.  I also called and spoke to Dr. Ines Bloomer nurse at [facility] surgery. Explained plan of care. Will office will post pone lumpectomy at this time until bx results are available.

## 2020-11-19 NOTE — Telephone Encounter (Signed)
  Sounds good.  M  

## 2020-11-19 NOTE — Progress Notes (Signed)
  Perioperative Services: Pre-Admission/Anesthesia Testing  Abnormal Lab Notification    Date: 11/19/20  Name: Holly Henderson MRN:   [id number]  Re: Abnormal labs noted during PAT appointment   Provider(s) Notified: Benjamine Sprague, DO Notification mode: Routed and/or faxed via [facility]   ABNORMAL LAB VALUE(S): Lab Results  Component Value Date   GLUCOSE 120 (H) 11/13/2020    Notes: Patient with a relatively new T2DM diagnosis. She is currently on oral (metformin), with plans to start dulagutide postoperatively. Last Hgb A1c was 13.2% on 10/17/2020. Patient will be holding her metformin for 2 days prior to her procedure. Given recent Bx proven malignancy, it is not in her best interest to postpone this procedure in order to achieve better glycemic control. Goal is to reduce risk of developing post-operative SSI,  however her risk is unable to be mitigated quickly, and delaying her procedure will only delay definitive treatment. Will send to surgeon to make him aware of most recent  This is a courtesy FYI; no formal response is required.  Honor Loh, MSN, APRN, FNP-C, CEN [facility]  Peri-operative Services Nurse Practitioner Phone: [phone] 11/19/20 3:28 PM

## 2020-11-19 NOTE — Telephone Encounter (Signed)
Contacted [facility]- RN left voice mail to get MRI bx to be set up. Dr. Mike Gip presented the patient in tumor board. Dr. Mike Gip is recommending that patient have all 3 breast lesions biopsied. Orders entered by provider.

## 2020-11-19 NOTE — Patient Instructions (Signed)
Your procedure is scheduled on: 11/22/20 Report to  [facility] at pre scheduled time (10:30 am). To find out your arrival time please call [phone] between 1PM - 3PM on 11/21/20.  Remember: Instructions that are not followed completely may result in serious medical risk, up to and including death, or upon the discretion of your surgeon and anesthesiologist your surgery may need to be rescheduled.     _X__ 1. Do not eat food after midnight the night before your procedure.                 No gum chewing or hard candies. You may drink clear liquids up to 2 hours                 before you are scheduled to arrive for your surgery-                  . Diabetics water only  __X__2.  On the morning of surgery brush your teeth with toothpaste and water, you                 may rinse your mouth with mouthwash if you wish.  Do not swallow any              toothpaste of mouthwash.     _X__ 3.  No Alcohol for 24 hours before or after surgery.   _X__ 4.  Do Not Smoke or use e-cigarettes For 24 Hours Prior to Your Surgery.                 Do not use any chewable tobacco products for at least 6 hours prior to                 surgery.  ____  5.  Bring all medications with you on the day of surgery if instructed.   __X__  6.  Notify your doctor if there is any change in your medical condition      (cold, fever, infections).     Do not wear jewelry, make-up, hairpins, clips or nail polish. Do not wear lotions, powders, deodorants or perfumes.  Do not shave 48 hours prior to surgery. Men may shave face and neck. Do not bring valuables to the hospital.    [facility] is not responsible for any belongings or valuables.  Contacts, dentures/partials or body piercings may not be worn into surgery. Bring a case for your contacts, glasses or hearing aids, a denture cup will be supplied.BRING CONTAINER FOR YOUR CONTACTS Leave your suitcase in the car. After surgery it may be brought  to your room. For patients admitted to the hospital, discharge time is determined by your treatment team.   Patients discharged the day of surgery will not be allowed to drive home.   Please read over the following fact sheets that you were given:   MRSA Information, CHG soap  __X__ Take these medicines the morning of surgery with A SIP OF WATER:    1. none  2.   3.   4.  5.  6.  ____ Fleet Enema (as directed)   __X__ Use CHG Soap/SAGE wipes as directed  ____ Use inhalers on the day of surgery  __X__ Hold metformin x 4 doses   ____ Take 1/2 of usual insulin dose the night before surgery. No insulin the morning          of surgery.   ____ Stop Blood Thinners  Coumadin/Plavix/Xarelto/Pleta/Pradaxa/Eliquis/Effient/Aspirin  on   Or contact your Surgeon, Cardiologist or Medical Doctor regarding  ability to stop your blood thinners  __X__ Stop Anti-inflammatories 7 days before surgery such as Advil, Ibuprofen, Motrin,  BC or Goodies Powder, Naprosyn, Naproxen, Aleve, Aspirin    __X__ Stop all herbal supplements, fish oil or vitamin E until after surgery.    ____ Bring C-Pap to the hospital.

## 2020-11-19 NOTE — Telephone Encounter (Signed)
Patient contacted with the plan of care. Explained that Dr. Loletha Grayer presented the patient in breast conference today. It was recommended that she have 3 additional MRI guided biopsies. She was informed [facility] will be in contact with her to set up this procedure. Her Lumpectomy procedure will need to be on hold until these biopsies are obtained. Patient inquired if the biopsy site from 11/05/20 would be rebiopsied or if there were 3 additional areas since in addition to the areas biopsised on 3/14. She would like Dr. Mike Gip to call her to discuss. She also inquired with Dr. Lurline Hare was aware of the plan of care.

## 2020-11-19 NOTE — Telephone Encounter (Signed)
Per Sam in [place]. "[facility] does not do MRI guided biopsies. She will need to be referred to [facility] in [place] for those specific breast biopsies."

## 2020-11-19 NOTE — Telephone Encounter (Signed)
Dr. Jacinto Reap- patient's MRI biopsy is not until 11/29/20.  Do we need to consider moving this apt to 2nd week in April?

## 2020-11-20 ENCOUNTER — Other Ambulatory Visit: Admission: RE | Admit: 2020-11-20 | Payer: BC Managed Care – PPO | Source: Ambulatory Visit

## 2020-11-20 ENCOUNTER — Encounter: Payer: Self-pay | Admitting: *Deleted

## 2020-11-20 NOTE — Telephone Encounter (Signed)
11/20/2020 Spoke w/ pt and informed her appt was moved to 4/14 to ensure results are back, pt confirmed appt  SRW

## 2020-11-20 NOTE — Telephone Encounter (Signed)
Please change the apt on 4/5 to 4/14 due to not having biopsy results back. Please inform patient of new apt dates.

## 2020-11-21 ENCOUNTER — Ambulatory Visit: Admission: RE | Admit: 2020-11-21 | Payer: BC Managed Care – PPO | Source: Ambulatory Visit

## 2020-11-22 ENCOUNTER — Ambulatory Visit: Payer: BC Managed Care – PPO

## 2020-11-27 ENCOUNTER — Ambulatory Visit: Payer: BC Managed Care – PPO | Admitting: Hematology and Oncology

## 2020-11-29 ENCOUNTER — Ambulatory Visit
Admission: RE | Admit: 2020-11-29 | Discharge: 2020-11-29 | Disposition: A | Discharge status: Home / Self Care | Payer: BC Managed Care – PPO | Source: Ambulatory Visit | Attending: Hematology and Oncology | Admitting: Hematology and Oncology

## 2020-11-29 ENCOUNTER — Other Ambulatory Visit: Payer: Self-pay

## 2020-11-29 ENCOUNTER — Other Ambulatory Visit: Payer: Self-pay | Admitting: Diagnostic Radiology

## 2020-11-29 DIAGNOSIS — R928 Other abnormal and inconclusive findings on diagnostic imaging of breast: Secondary | ICD-10-CM

## 2020-11-29 MED ORDER — GADOBUTROL 1 MMOL/ML IV SOLN: 10.0000 mL | Freq: Once | INTRAVENOUS | Status: DC | PRN

## 2020-11-30 ENCOUNTER — Ambulatory Visit: Payer: BC Managed Care – PPO

## 2020-11-30 ENCOUNTER — Ambulatory Visit: Admission: RE | Admit: 2020-11-30 | Payer: BC Managed Care – PPO | Source: Home / Self Care | Admitting: Surgery

## 2020-11-30 ENCOUNTER — Encounter: Admission: RE | Payer: Self-pay | Source: Home / Self Care

## 2020-11-30 SURGERY — BREAST LUMPECTOMY,RADIO FREQ LOCALIZER,AXILLARY SENTINEL LYMPH NODE BIOPSY
Anesthesia: General | Laterality: Left

## 2020-12-05 NOTE — Progress Notes (Signed)
[facility]  [address] [place], [place] [address] Phone: [phone]  Fax: [phone]   Clinic Day:  12/06/2020  Referring physician: Ulyess Blossom, PA  Chief Complaint: Holly Henderson is a 65 y.o. female with left breast cancer who is seen for assessment after interval breast biopsies and discussion regarding direction of therapy.   HPI: The patient was last seen in the medical oncology clinic on 11/13/2020 for new patient assessment. At that time, she felt "good." She had shortness of breath on exertion.  She was intentionally losing weight.  Exam revealed post biopsy changes. Breast biopsy revealed grade I lobular carcinoma.  We discussed breast MRI given the lobular nature of her tumor.  Hematocrit was 40.4, hemoglobin 13.5, platelets 261,000, WBC 7,800. CA27.29 was 28.6.  Bilateral breast MRI with and without contrast on 11/16/2020 revealed suspicious findings in the left breast. Recommended 3 site biopsy in the left breast: 1) 0.5 cm mass medial upper left breast. 2) 1 cm area of focal enhancement posterior lateral lower left breast. 3) Most posterior aspect of enhancement in the previously biopsy tumor area to assess posterior extent of disease.  Lateral mid left breast needle biopsy on 11/29/2020 revealed invasive lobular carcinoma with DCIS and lobular neoplasia. Lower outer left breast needle biopsy revealed a focus suspicious for perineural invasion and lobular neoplasia (atypical lobular hyperplasia). Upper inner left breast biopsy revealed focal fat necrosis with associated inflammation. This was found discordant.  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  Pathology revealing focal suspicious for perineural invasion, lobular neoplasia (atypical lobular hyperplasia) previous biopsy site  changes of the left breast, lower outer were felt concordant.  Tumor cells were ER + (85%), PR + (95%), HER2 - (1+). Ki67 was 2%.  During the interim, she has been "here." She is unsure whether she is leaning towards mastectomy or lumpectomy. She would like to avoid a mastectomy if possible, but "wants to do what is best." She states that she is planning to pursue reconstructive surgery either way.   Past Medical History:  Diagnosis Date  . Diabetes mellitus without complication ([place])   . Hypertension   . Malignant neoplasm of upper-outer quadrant of left female breast ([place]) 11/13/2020    Past Surgical History:  Procedure Laterality Date  . BREAST BIOPSY Left 11/05/2020   stereo bx, x-clip, path pending   . CHOLECYSTECTOMY    . FRACTURE SURGERY     left - right - left  . TOTAL KNEE ARTHROPLASTY Left 01/26/2020   Procedure: TOTAL KNEE ARTHROPLASTY;  Surgeon: Corky Mull, MD;  Location: [facility] ORS;  Service: Orthopedics;  Laterality: Left;    Family History  Problem Relation Age of Onset  . Breast cancer Mother 64    Social History:  reports that she has never smoked. She has never used smokeless tobacco. She reports current alcohol use. She reports that she does not use drugs. She has never smoked. She has not had any alcohol since she was diagnosed with diabetes. She denies exposure to radiation or toxins. She has 6 children. She previously lived in [place]. 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 The patient is alone today.  Allergies:  Allergies  Allergen Reactions  . Latex Other (See Comments)    Irritation/gyn    Current Medications: Current Outpatient Medications  Medication Sig Dispense Refill  .  Dulaglutide (TRULICITY [place]) Inject into the skin.    Marland Kitchen ergocalciferol (VITAMIN D2) 1.25 MG (50000 UT) capsule Take 50,000 Units by mouth 2 (two) times a week. Wednesday and Saturday    . losartan (COZAAR) 50 MG tablet Take 50 mg by mouth daily.    . metFORMIN (GLUCOPHAGE) 1000  MG tablet Take 1,000 mg by mouth 2 (two) times daily with a meal.    . Multiple Vitamins-Minerals (MULTIVITAMIN WITH MINERALS) tablet Take 1 tablet by mouth daily.    Marland Kitchen spironolactone (ALDACTONE) 25 MG tablet Take 25 mg by mouth daily.     No current facility-administered medications for this visit.    Review of Systems  Constitutional: Positive for weight loss (7 lbs). Negative for chills, diaphoresis, fever and malaise/fatigue.  HENT: Positive for congestion (allergies). Negative for ear discharge, ear pain, hearing loss, nosebleeds, sinus pain, sore throat and tinnitus.   Eyes: Negative for blurred vision.  Respiratory: Positive for cough (allergies) and shortness of breath (with exertion). Negative for hemoptysis and sputum production.   Cardiovascular: Negative for chest pain, palpitations and leg swelling.  Gastrointestinal: Negative for abdominal pain, blood in stool, constipation, diarrhea, heartburn, melena, nausea and vomiting.  Genitourinary: Negative for dysuria, frequency, hematuria and urgency.  Musculoskeletal: Positive for joint pain (left shoulder stiffness, occasional). Negative for back pain, myalgias and neck pain.  Skin: Negative for itching and rash.  Neurological: Negative for dizziness, tingling, sensory change, weakness and headaches.  Endo/Heme/Allergies: Positive for environmental allergies. Does not bruise/bleed easily.       Diabetes.  Psychiatric/Behavioral: Negative for depression and memory loss. The patient is not nervous/anxious and does not have insomnia.   All other systems reviewed and are negative.  Performance status (ECOG): 1  Vitals Blood pressure 122/63, pulse 79, temperature (!) 96.2 F (35.7 C), temperature source Tympanic, weight 268 lb 4.8 oz (121.7 kg), SpO2 100 %.   Physical Exam Vitals and nursing note reviewed.  Constitutional:      General: She is not in acute distress.    Appearance: She is not diaphoretic.  Eyes:     General: No  scleral icterus.    Conjunctiva/sclera: Conjunctivae normal.  Neurological:     Mental Status: She is alert and oriented to person, place, and time.  Psychiatric:        Behavior: Behavior normal.        Thought Content: Thought content normal.        Judgment: Judgment normal.    No visits with results within 3 Day(s) from this visit.  Latest known visit with results is:  Appointment on 11/13/2020  Component Date Value Ref Range Status  . CA 27.29 11/13/2020 28.6  0.0 - 38.6 U/mL Final   Comment: (NOTE) Siemens Centaur Immunochemiluminometric Methodology [facility]) Values obtained with different assay methods or kits cannot be used interchangeably. Results cannot be interpreted as absolute evidence of the presence or absence of malignant disease. Performed At: [facility] [place], [place] [id number] Rush Farmer MD SH:[id number]   . Sodium 11/13/2020 138  135 - 145 mmol/L Final  . Potassium 11/13/2020 4.1  3.5 - 5.1 mmol/L Final  . Chloride 11/13/2020 107  98 - 111 mmol/L Final  . CO2 11/13/2020 23  22 - 32 mmol/L Final  . Glucose, Bld 11/13/2020 120* 70 - 99 mg/dL Final   Glucose reference range applies only to samples taken after fasting for at least 8 hours.  . BUN 11/13/2020 15  8 -  23 mg/dL Final  . Creatinine, Ser 11/13/2020 0.64  0.44 - 1.00 mg/dL Final  . Calcium 11/13/2020 9.3  8.9 - 10.3 mg/dL Final  . Total Protein 11/13/2020 7.3  6.5 - 8.1 g/dL Final  . Albumin 11/13/2020 3.9  3.5 - 5.0 g/dL Final  . AST 11/13/2020 24  15 - 41 U/L Final  . ALT 11/13/2020 32  0 - 44 U/L Final  . Alkaline Phosphatase 11/13/2020 66  38 - 126 U/L Final  . Total Bilirubin 11/13/2020 0.9  0.3 - 1.2 mg/dL Final  . GFR, Estimated 11/13/2020 >60  >60 mL/min Final   Comment: (NOTE) Calculated using the CKD-EPI Creatinine Equation (2021)   . Anion gap 11/13/2020 8  5 - 15 Final   Performed at [facility], [address] [place], [place] [address]  .  WBC 11/13/2020 7.8  4.0 - 10.5 K/uL Final  . RBC 11/13/2020 4.50  3.87 - 5.11 MIL/uL Final  . Hemoglobin 11/13/2020 13.5  12.0 - 15.0 g/dL Final  . HCT 11/13/2020 40.4  36.0 - 46.0 % Final  . MCV 11/13/2020 89.8  80.0 - 100.0 fL Final  . MCH 11/13/2020 30.0  26.0 - 34.0 pg Final  . MCHC 11/13/2020 33.4  30.0 - 36.0 g/dL Final  . RDW 11/13/2020 13.5  11.5 - 15.5 % Final  . Platelets 11/13/2020 261  150 - 400 K/uL Final  . nRBC 11/13/2020 0.0  0.0 - 0.2 % Final  . Neutrophils Relative % 11/13/2020 71  % Final  . Neutro Abs 11/13/2020 5.5  1.7 - 7.7 K/uL Final  . Lymphocytes Relative 11/13/2020 18  % Final  . Lymphs Abs 11/13/2020 1.4  0.7 - 4.0 K/uL Final  . Monocytes Relative 11/13/2020 8  % Final  . Monocytes Absolute 11/13/2020 0.7  0.1 - 1.0 K/uL Final  . Eosinophils Relative 11/13/2020 2  % Final  . Eosinophils Absolute 11/13/2020 0.2  0.0 - 0.5 K/uL Final  . Basophils Relative 11/13/2020 1  % Final  . Basophils Absolute 11/13/2020 0.0  0.0 - 0.1 K/uL Final  . Immature Granulocytes 11/13/2020 0  % Final  . Abs Immature Granulocytes 11/13/2020 0.03  0.00 - 0.07 K/uL Final   Performed at [facility], [address] [place], [place] [address]     Assessment:  Britiany Silbernagel is a 65 y.o. female with left breast cancer s/p biopsy on 11/05/2020.  Pathology revealed a 6 mm, grade I, invasive lobular carcinoma, classic type. ER was positive (> 90%), PR positive (> 90%), and Her2/neu negative (1+).  Bilateral screening mammogram on 10/11/2020 revealed asymmetry with distortion in the left breast.  Left diagnostic mammogram on 10/29/2020 revealed focal asymmetry in the upper-outer quadrant of the LEFT breast, with possible subtle associated architectural distortion.  Bilateral breast MRI on 11/16/2020 revealed suspicious findings in the left breast. Recommended 3 site biopsy in the left breast: 1) 0.5 cm mass medial upper left breast. 2) 1 cm area of focal enhancement  posterior lateral lower left breast. 3) Most posterior aspect of enhancement in the previously biopsy tumor area to assess posterior extent of disease.  Lateral mid left breast needle biopsy on 11/29/2020 revealed invasive lobular carcinoma with DCIS and lobular neoplasia. Lower outer left breast needle biopsy revealed a focus suspicious for perineural invasion and lobular neoplasia (atypical lobular hyperplasia). Upper inner left breast biopsy revealed focal fat necrosis with associated inflammation. This was found discordant.  Pathology revealing grade II  invasive lobular carcinoma, ductal carcinoma in situ and lobular neoplasia (atypical lobular hyperplasia) of the left breast, lateral and mid was found concordant.  Pathology revealing focal suspicious for perineural invasion, lobular neoplasia (atypical lobular hyperplasia) previous biopsy site changes of the left breast, lower outer were felt concordant.  Tumor cells were ER + (85%), PR + (95%), HER2 - (1+). Ki67 was 2%.  Symptomatically, she denies any complaints.  She is unsure whether she is leaning towards mastectomy or lumpectomy.  She plans to pursue reconstructive surgery.  Plan: 1.   Invasive lobular left breast cancer  Review interval breast MRI - 3 biopsy sites recommended.  Review interval breast biopsies.   Location of biopsies and pathology reviewed with patient.   Biopsies and sites reviewed with interventional radiology.     Area of concern spans 11 cm.    All sites would need to be excised.  Discuss mastectomy or partial mastectomy.   Discuss patient's thoughts about reconstructive surgery.    Discuss referral to plastic surgeon.    If patient has undergoes partial mastectomy, radiation would be required.   Multiple questions asked and answered.  Discuss plan for adjuvant endocrine therapy (tamoxifen vs AI) s/p local therapy.   Potential side effects reviewed.   Discuss baseline bone density study.  Present at tumor board  on 12/17/2020. 2.   Bone density on 12/12/2020. 3.   Plastic surgery consult. 4.   MD to call patient after tumor board. 5.   RTC 2 weeks after surgery for MD assessment.  Patient to call for appointment.  I discussed the assessment and treatment plan with the patient.  The patient was provided an opportunity to ask questions and all were answered.  The patient agreed with the plan and demonstrated an understanding of the instructions.  The patient was advised to call back if the symptoms worsen or if the condition fails to improve as anticipated.  I provided 19 minutes of face-to-face time during this this encounter and > 50% was spent counseling as documented under my assessment and plan.  An additional 10+ minutes were spent reviewing her chart (Epic and Care Everywhere) including notes, labs, and imaging studies as well as detailed conversations with Dr Nolon Nations, radiologist in the breast center.    Allante Whitmire C. Mike Gip, MD, PhD    12/06/2020, 12:32 PM  I, Mirian Mo Tufford, am acting as Education administrator for [employer]. Mike Gip, MD, PhD.  I, Deshan Hemmelgarn C. Mike Gip, MD, have reviewed the above documentation for accuracy and completeness, and I agree with the above.

## 2020-12-06 ENCOUNTER — Encounter: Payer: Self-pay | Admitting: Hematology and Oncology

## 2020-12-06 ENCOUNTER — Other Ambulatory Visit: Payer: Self-pay

## 2020-12-06 ENCOUNTER — Inpatient Hospital Stay: Payer: BC Managed Care – PPO | Attending: Hematology and Oncology | Admitting: Hematology and Oncology

## 2020-12-06 VITALS — BP 122/63 | HR 79 | Temp 96.2°F | Wt 268.3 lb

## 2020-12-06 DIAGNOSIS — C50412 Malignant neoplasm of upper-outer quadrant of left female breast: Secondary | ICD-10-CM | POA: Diagnosis present

## 2020-12-06 DIAGNOSIS — Z79899 Other long term (current) drug therapy: Secondary | ICD-10-CM | POA: Insufficient documentation

## 2020-12-06 DIAGNOSIS — R059 Cough, unspecified: Secondary | ICD-10-CM | POA: Diagnosis not present

## 2020-12-06 DIAGNOSIS — R634 Abnormal weight loss: Secondary | ICD-10-CM | POA: Insufficient documentation

## 2020-12-06 DIAGNOSIS — Z9049 Acquired absence of other specified parts of digestive tract: Secondary | ICD-10-CM | POA: Diagnosis not present

## 2020-12-06 DIAGNOSIS — Z803 Family history of malignant neoplasm of breast: Secondary | ICD-10-CM | POA: Diagnosis not present

## 2020-12-06 DIAGNOSIS — Z170 Estrogen receptor positive status [ER+]: Secondary | ICD-10-CM | POA: Diagnosis not present

## 2020-12-06 DIAGNOSIS — I10 Essential (primary) hypertension: Secondary | ICD-10-CM | POA: Diagnosis not present

## 2020-12-06 DIAGNOSIS — M2550 Pain in unspecified joint: Secondary | ICD-10-CM | POA: Insufficient documentation

## 2020-12-12 ENCOUNTER — Other Ambulatory Visit: Payer: Self-pay

## 2020-12-12 ENCOUNTER — Encounter: Payer: Self-pay | Admitting: Plastic Surgery

## 2020-12-12 ENCOUNTER — Ambulatory Visit: Payer: BC Managed Care – PPO | Admitting: Plastic Surgery

## 2020-12-12 VITALS — BP 144/88 | HR 98 | Ht 64.0 in | Wt 264.0 lb

## 2020-12-12 DIAGNOSIS — C50412 Malignant neoplasm of upper-outer quadrant of left female breast: Secondary | ICD-10-CM

## 2020-12-12 DIAGNOSIS — Z170 Estrogen receptor positive status [ER+]: Secondary | ICD-10-CM

## 2020-12-12 NOTE — Progress Notes (Signed)
Patient ID: Holly Henderson, female    DOB: December 10, 1955, 65 y.o.   MRN: [id number]   Chief Complaint  Patient presents with  . Advice Only  . Breast Cancer    The patient is a 65 year old female here for a consultation for breast reconstruction.  She has been diagnosed with left-sided lobular carcinoma.  The patient had a normal mammogram in 2020.  She repeated it and February 2022 and it was abnormal in the upper outer quadrant.  The pathology showed estrogen and progesterone positive and HER2 negative.  Her past medical history is positive for diabetes, gastroesophageal reflux, history of cancer, hypertension, osteoarthritis of the knee and sleep apnea.  Her past surgical history includes a cholecystectomy, wisdom teeth, endometrial ablation and left knee surgery.  She is not a smoker.  The patient does have a family history of breast cancer.  She is interested in gastric bypass and has been undergoing a work-up for this.  She is 5 feet 4 inches tall and weighs 264 pounds.  Her preoperative bra size is a DD cup.  Right now she is thinking about trying to decide between a partial mastectomy and a full mastectomy.   Review of Systems  Constitutional: Negative.   HENT: Negative.   Eyes: Negative.   Respiratory: Negative.  Negative for chest tightness and shortness of breath.   Cardiovascular: Negative.  Negative for leg swelling.  Gastrointestinal: Negative for abdominal distention.  Endocrine: Negative.   Genitourinary: Negative.   Musculoskeletal: Negative.   Skin: Negative.   Psychiatric/Behavioral: Negative.     Past Medical History:  Diagnosis Date  . Diabetes mellitus without complication ([place])   . Hypertension   . Malignant neoplasm of upper-outer quadrant of left female breast ([place]) 11/13/2020    Past Surgical History:  Procedure Laterality Date  . BREAST BIOPSY Left 11/05/2020   stereo bx, x-clip, path pending   . CHOLECYSTECTOMY    . FRACTURE SURGERY     left -  right - left  . TOTAL KNEE ARTHROPLASTY Left 01/26/2020   Procedure: TOTAL KNEE ARTHROPLASTY;  Surgeon: Corky Mull, MD;  Location: [facility] ORS;  Service: Orthopedics;  Laterality: Left;      Current Outpatient Medications:  .  Dulaglutide (TRULICITY [place]), Inject into the skin., Disp: , Rfl:  .  ergocalciferol (VITAMIN D2) 1.25 MG (50000 UT) capsule, Take 50,000 Units by mouth 2 (two) times a week. Wednesday and Saturday, Disp: , Rfl:  .  losartan (COZAAR) 50 MG tablet, Take 50 mg by mouth daily., Disp: , Rfl:  .  metFORMIN (GLUCOPHAGE) 1000 MG tablet, Take 1,000 mg by mouth 2 (two) times daily with a meal., Disp: , Rfl:  .  Multiple Vitamins-Minerals (MULTIVITAMIN WITH MINERALS) tablet, Take 1 tablet by mouth daily., Disp: , Rfl:  .  spironolactone (ALDACTONE) 25 MG tablet, Take 25 mg by mouth daily., Disp: , Rfl:    Objective:   Vitals:   12/12/20 1450  BP: (!) 144/88  Pulse: 98  SpO2: 99%    Physical Exam Vitals and nursing note reviewed.  Constitutional:      Appearance: Normal appearance.  HENT:     Head: Normocephalic and atraumatic.  Cardiovascular:     Rate and Rhythm: Normal rate.     Pulses: Normal pulses.  Pulmonary:     Effort: Pulmonary effort is normal.  Abdominal:     General: Abdomen is flat. There is no distension.     Tenderness: There is  no abdominal tenderness.  Musculoskeletal:        General: No swelling or deformity.  Skin:    General: Skin is warm.     Capillary Refill: Capillary refill takes less than 2 seconds.     Coloration: Skin is not jaundiced.     Findings: Bruising present.  Neurological:     General: No focal deficit present.     Mental Status: She is alert and oriented to person, place, and time.  Psychiatric:        Mood and Affect: Mood normal.        Behavior: Behavior normal.     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 Several reconstruction options were explained to the patient.  It is important to remember that breast reconstruction is an optional procedure. Reconstruction often requires several stages of surgery and this means more than one operation.  The surgeries are often done several months apart.  The entire process from start to finish can take a year or more. The major goal of breast reconstruction is to look normal in clothing. There will always be scars and a difference noticeable without clothes.  This is true for asymmetries where both breasts will not be identical.  Surgery may be needed or desired to the non-cancerous breast in order to achieve better symmetry and satisfactory results.  Regardless of the reconstructive method, there is always risks and the possibility that the procedure will fail or have complications.  This could required additional surgeries.    We discussed the available methods of breast reconstruction and included:  1. Tissue expander with Acellular dermal matrix followed by implant based reconstruction. This can be done as one surgery or multiple surgeries.  2. Autologous reconstruction can include using a muscle or tissue from another area of the body for the reconstruction.  3. Combined procedures like the latissismus dorsi flaps that often uses the muscle with an expander or implant.  For each of the method discussed the risks, benefits, scars and recovery time were discussed in detail. Specific risks included bleeding, infection, hematoma, seroma, scarring, pain, wound healing complications, flap loss, fat necrosis, capsular contracture, need for implant removal, donor site complications, bulge, hernia, umbilical necrosis, need for urgent reoperation, and need for dressing changes.   After the options were discussed we focused on the patient's desires and the procedure that was best for her based on all the  information.  A total of 45 minutes of face-to-face time was spent in this encounter, of which >60% was spent in counseling.     The patient is a candidate for reconstruction.  She does have increased risks of complications due to her diabetes and her size.  She is going to wait until she hears from her oncologist on Monday.  She will then be decide between a partial mastectomy with oncoplastic reduction bilaterally versus a left mastectomy with implant-based reconstruction.  She understands that this is a 2-monthprocess.  If radiation is required we would try to speed that process.  The goal would be to complete everything prior to any radiation.  We will plan to talk after she hears back from her oncologist.  She has a telemetry visit set up for Tuesday.  Pictures were obtained of the patient and placed in the chart with the patient's or guardian's permission.   CLyndee Leo  S Kin Galbraith, DO

## 2020-12-18 ENCOUNTER — Encounter: Payer: Self-pay | Admitting: Plastic Surgery

## 2020-12-18 ENCOUNTER — Other Ambulatory Visit: Payer: Self-pay | Admitting: *Deleted

## 2020-12-18 ENCOUNTER — Other Ambulatory Visit: Payer: Self-pay

## 2020-12-18 ENCOUNTER — Encounter: Payer: Self-pay | Admitting: *Deleted

## 2020-12-18 ENCOUNTER — Telehealth (INDEPENDENT_AMBULATORY_CARE_PROVIDER_SITE_OTHER): Payer: BC Managed Care – PPO | Admitting: Plastic Surgery

## 2020-12-18 DIAGNOSIS — C50412 Malignant neoplasm of upper-outer quadrant of left female breast: Secondary | ICD-10-CM

## 2020-12-18 DIAGNOSIS — Z170 Estrogen receptor positive status [ER+]: Secondary | ICD-10-CM

## 2020-12-18 DIAGNOSIS — C50912 Malignant neoplasm of unspecified site of left female breast: Secondary | ICD-10-CM

## 2020-12-18 NOTE — Progress Notes (Signed)
   Subjective:    Patient ID: Holly Henderson, female    DOB: [birth year], 65 y.o.   MRN: [id number]  The patient is a 65 year old female joining me by telemetry visit.  I heard back from Dr. Lysle Pearl and he indicates that the conference favored bilateral oncoplastic breast reduction with postop radiation.  She has left-sided lobular carcinoma diagnosed after an abnormal mammogram in February 2022.  The pathology showed estrogen and progesterone positive and HER2 negative breast cancer.  She has diabetes and gastroesophageal reflux disease, history of cancer, hypertension, osteoarthritis and sleep apnea.  Her past surgical history includes cholecystectomy, wisdom teeth, endometrial ablation and knee surgery.  She is not a smoker.  She is interested in gastric bypass at a later time.  She is 5 feet 4 inches tall weighs 264 pounds.    Review of Systems  Constitutional: Negative.        Objective:   Physical Exam Tele visit     Assessment & Plan:     ICD-10-CM   1. Malignant neoplasm of upper-outer quadrant of left breast in female, estrogen receptor positive ([place])  C50.412    Z17.0     I connected with  Holly Henderson on 12/18/20 by a video enabled telemedicine application and verified that I am speaking with the correct person using two identifiers.  The patient was at work and I was at my office.  We discussed her case for 5 minutes.   I discussed the limitations of evaluation and management by telemedicine. The patient expressed understanding and agreed to proceed.  The patient has decided on bilateral oncoplastic breast reductions.  She will let me know if there is any change after she talks with Dr. Susy Manor.  I will put in for surgery with Dr. Lysle Pearl for bilateral oncoplastic breast reductions.  She would like to move ahead with surgery.  She says she has been dealing with this for about 2 months and is looking forward to getting rid of the cancer.

## 2020-12-18 NOTE — Progress Notes (Signed)
Received Mychart message from patient today with questions regarding her bone denisty appointment and wanting to hear from Dr. Mike Gip after tumor conference yesterday.  Informed patient that Dr. Mike Gip resigned and I would need to set her up with one of other oncologist.  I also sent Dr. Lysle Pearl a message to contact patient about the plan for her surgery.  He did respond that he would talk with Dr. Marla Roe.  Patient had a video visit with Dr. Marla Roe today.  Informed patient that I have scheduled her to see Dr. Tasia Catchings on Friday at 3:00 and that it looks like there is a surgery date scheduled for 01/02/21.  Informed her to call Dr. Ines Bloomer office if she does not hear from his office tomorrow.  She was also encouraged to call me back with any questions or needs.

## 2020-12-19 ENCOUNTER — Other Ambulatory Visit: Payer: Self-pay | Admitting: Surgery

## 2020-12-19 ENCOUNTER — Other Ambulatory Visit: Payer: Self-pay | Admitting: Oncology

## 2020-12-19 DIAGNOSIS — C50912 Malignant neoplasm of unspecified site of left female breast: Secondary | ICD-10-CM

## 2020-12-20 ENCOUNTER — Other Ambulatory Visit: Payer: Self-pay | Admitting: Surgery

## 2020-12-20 DIAGNOSIS — Z853 Personal history of malignant neoplasm of breast: Secondary | ICD-10-CM

## 2020-12-21 ENCOUNTER — Encounter: Payer: Self-pay | Admitting: Oncology

## 2020-12-21 ENCOUNTER — Inpatient Hospital Stay (HOSPITAL_BASED_OUTPATIENT_CLINIC_OR_DEPARTMENT_OTHER): Payer: BC Managed Care – PPO | Admitting: Oncology

## 2020-12-21 VITALS — BP 129/68 | HR 73 | Temp 96.3°F | Resp 16 | Wt 266.4 lb

## 2020-12-21 DIAGNOSIS — Z170 Estrogen receptor positive status [ER+]: Secondary | ICD-10-CM

## 2020-12-21 DIAGNOSIS — Z7189 Other specified counseling: Secondary | ICD-10-CM | POA: Diagnosis not present

## 2020-12-21 DIAGNOSIS — C50412 Malignant neoplasm of upper-outer quadrant of left female breast: Secondary | ICD-10-CM | POA: Diagnosis not present

## 2020-12-21 DIAGNOSIS — C50812 Malignant neoplasm of overlapping sites of left female breast: Secondary | ICD-10-CM | POA: Diagnosis not present

## 2020-12-22 DIAGNOSIS — Z170 Estrogen receptor positive status [ER+]: Secondary | ICD-10-CM | POA: Insufficient documentation

## 2020-12-22 DIAGNOSIS — C50812 Malignant neoplasm of overlapping sites of left female breast: Secondary | ICD-10-CM | POA: Insufficient documentation

## 2020-12-22 DIAGNOSIS — Z7189 Other specified counseling: Secondary | ICD-10-CM | POA: Insufficient documentation

## 2020-12-22 NOTE — [place] Notes (Signed)
[facility]  [address], [address] [place], [place] [address] Phone: [phone]  Fax: [phone]   Clinic Day:  12/22/2020  Referring physician: Ulyess Blossom, PA  Chief Complaint: Holly Henderson is a 65 y.o. female presents for follow-up of left breast lobular carcinoma.  PERTINENT ONCOLOGY HISTORY Holly Henderson is a 65 y.o.afemale who has above oncology history reviewed by me today presented for follow up visit for management of left breast lobular carcinoma  Patient previously followed up by Dr.Corcoran, patient switched care to me on 12/22/20 Extensive medical records review was performed by me 10/11/2020 Bilateral screening mammogram on  revealed asymmetry with distortion in the left breast.   10/29/2020 Left diagnostic mammogram  revealed focal asymmetry in the upper-outer quadrant of the LEFT breast, with possible subtle associated architectural distortion.  11/05/2020, left upper outer quadrant breast mass biopsy showed grade 1 invasive lobular carcinoma, ER 90%, PR 90%, HER2 negative IHC  11/16/2020 bilateral breast MRI revealed suspicious findings in the left breast. Recommended 3 site biopsy in the left breast: 1) 0.5 cm mass medial upper left breast. 2) 1 cm area of focal enhancement posterior lateral lower left breast. 3) Most posterior aspect of enhancement in the previously biopsy tumor area to assess posterior extent of disease.  11/29/2020 patient underwent additional biopsy of the left breast Lateral mid left breast needle biopsy  revealed invasive lobular carcinoma with DCIS and lobular neoplasia- (atypical lobular hyperplasia) Lower outer left breast needle biopsy revealed a focus suspicious for perineural invasion and lobular neoplasia (atypical lobular hyperplasia).  Upper inner left breast biopsy revealed focal fat necrosis with associated inflammation. Tumor cells were ER + (85%), PR + (95%), HER2 - (1+). Ki67 was  2%.  Patient's case was discussed on breast tumor board 12/17/2020. Area of concern spans 11-12 centimeters in all sites will need to be resected.  Patient has established care with Dr. Lysle Pearl and Dr. Marla Roe. Patient has decided on bilateral oncoplastic breast reduction. Patient presents with establish care with me today.   Review of Systems  Constitutional: Negative for chills, fever, malaise/fatigue and weight loss.  HENT:   Negative for sore throat.   Eyes: Negative for eye problems and redness.  Respiratory: Negative for cough, shortness of breath and wheezing.   Cardiovascular: Negative for chest pain, leg swelling and palpitations.  Gastrointestinal: Negative for abdominal pain, blood in stool, nausea and vomiting.  Endocrine: Negative for hot flashes.  Genitourinary: Negative for dysuria.   Musculoskeletal: Positive for arthralgias. Negative for myalgias.  Skin: Negative for rash.  Neurological: Negative for dizziness, tingling and tremors.  Hematological: Does not bruise/bleed easily.  Psychiatric/Behavioral: Negative for hallucinations.     Past Medical History:  Diagnosis Date  . Diabetes mellitus without complication ([place])   . Hypertension   . Malignant neoplasm of upper-outer quadrant of left female breast ([place]) 11/13/2020    Past Surgical History:  Procedure Laterality Date  . BREAST BIOPSY Left 11/05/2020   stereo bx, x-clip, path pending   . CHOLECYSTECTOMY    . FRACTURE SURGERY     left - right - left  . TOTAL KNEE ARTHROPLASTY Left 01/26/2020   Procedure: TOTAL KNEE ARTHROPLASTY;  Surgeon: Corky Mull, MD;  Location: [facility] ORS;  Service: Orthopedics;  Laterality: Left;    Family History  Problem Relation Age of Onset  . Breast cancer Mother 64    Social History:  reports that she has never smoked. She has never used smokeless  tobacco. She reports current alcohol use. She reports that she does not use drugs. She has never smoked. She has not had any  alcohol since she was diagnosed with diabetes. She denies exposure to radiation or toxins. She has 6 children. She previously lived in [place]. 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 The patient is alone today.  Allergies:  Allergies  Allergen Reactions  . Latex Other (See Comments)    Irritation/gyn    Current Medications: Current Outpatient Medications  Medication Sig Dispense Refill  . Dulaglutide 0.75 MG/0.5ML SOPN Inject 0.75 mg into the skin every Monday.    . losartan (COZAAR) 50 MG tablet Take 50 mg by mouth daily.    . metFORMIN (GLUCOPHAGE) 1000 MG tablet Take 1,000 mg by mouth 2 (two) times daily with a meal.    . Multiple Vitamins-Minerals (MULTIVITAMIN WITH MINERALS) tablet Take 1 tablet by mouth daily.    Marland Kitchen spironolactone (ALDACTONE) 25 MG tablet Take 25 mg by mouth daily.     No current facility-administered medications for this visit.     Performance status (ECOG): 1  Vitals Blood pressure 129/68, pulse 73, temperature (!) 96.3 F (35.7 C), temperature source Tympanic, resp. rate 16, weight 266 lb 6.4 oz (120.8 kg), SpO2 100 %.   Physical Exam Vitals and nursing note reviewed.  Constitutional:      General: She is not in acute distress.    Appearance: She is not diaphoretic.  Eyes:     General: No scleral icterus.    Conjunctiva/sclera: Conjunctivae normal.  Cardiovascular:     Rate and Rhythm: Normal rate and regular rhythm.     Pulses: Normal pulses.  Pulmonary:     Effort: Pulmonary effort is normal.     Breath sounds: Normal breath sounds.  Abdominal:     General: There is no distension.  Musculoskeletal:        General: No swelling.     Cervical back: Normal range of motion.  Skin:    General: Skin is warm and dry.  Neurological:     General: No focal deficit present.     Mental Status: She is alert and oriented to person, place, and time.  Psychiatric:        Mood and Affect: Mood normal.   Right: No swelling, bleeding, mass, skin  change, tenderness, axillary adenopathy or supraclavicular adenopathy.  Left: Bruising at the biopsy site.  No swelling, bleeding, axillary adenopathy or supraclavicular adenopathy.    No visits with results within 3 Day(s) from this visit.  Latest known visit with results is:  Appointment on 11/13/2020  Component Date Value Ref Range Status  . CA 27.29 11/13/2020 28.6  0.0 - 38.6 U/mL Final   Comment: (NOTE) Siemens Centaur Immunochemiluminometric Methodology [facility]) Values obtained with different assay methods or kits cannot be used interchangeably. Results cannot be interpreted as absolute evidence of the presence or absence of malignant disease. Performed At: [facility] [place], [place] [id number] Rush Farmer MD BJ:[id number]   . Sodium 11/13/2020 138  135 - 145 mmol/L Final  . Potassium 11/13/2020 4.1  3.5 - 5.1 mmol/L Final  . Chloride 11/13/2020 107  98 - 111 mmol/L Final  . CO2 11/13/2020 23  22 - 32 mmol/L Final  . Glucose, Bld 11/13/2020 120* 70 - 99 mg/dL Final   Glucose reference range applies only to samples taken after fasting for at least 8 hours.  . BUN 11/13/2020 15  8 - 23  mg/dL Final  . Creatinine, Ser 11/13/2020 0.64  0.44 - 1.00 mg/dL Final  . Calcium 11/13/2020 9.3  8.9 - 10.3 mg/dL Final  . Total Protein 11/13/2020 7.3  6.5 - 8.1 g/dL Final  . Albumin 11/13/2020 3.9  3.5 - 5.0 g/dL Final  . AST 11/13/2020 24  15 - 41 U/L Final  . ALT 11/13/2020 32  0 - 44 U/L Final  . Alkaline Phosphatase 11/13/2020 66  38 - 126 U/L Final  . Total Bilirubin 11/13/2020 0.9  0.3 - 1.2 mg/dL Final  . GFR, Estimated 11/13/2020 >60  >60 mL/min Final   Comment: (NOTE) Calculated using the CKD-EPI Creatinine Equation (2021)   . Anion gap 11/13/2020 8  5 - 15 Final   Performed at [facility], [address], [place], [place] [address]  . WBC 11/13/2020 7.8  4.0 - 10.5 K/uL Final  . RBC 11/13/2020 4.50  3.87 - 5.11 MIL/uL Final  .  Hemoglobin 11/13/2020 13.5  12.0 - 15.0 g/dL Final  . HCT 11/13/2020 40.4  36.0 - 46.0 % Final  . MCV 11/13/2020 89.8  80.0 - 100.0 fL Final  . MCH 11/13/2020 30.0  26.0 - 34.0 pg Final  . MCHC 11/13/2020 33.4  30.0 - 36.0 g/dL Final  . RDW 11/13/2020 13.5  11.5 - 15.5 % Final  . Platelets 11/13/2020 261  150 - 400 K/uL Final  . nRBC 11/13/2020 0.0  0.0 - 0.2 % Final  . Neutrophils Relative % 11/13/2020 71  % Final  . Neutro Abs 11/13/2020 5.5  1.7 - 7.7 K/uL Final  . Lymphocytes Relative 11/13/2020 18  % Final  . Lymphs Abs 11/13/2020 1.4  0.7 - 4.0 K/uL Final  . Monocytes Relative 11/13/2020 8  % Final  . Monocytes Absolute 11/13/2020 0.7  0.1 - 1.0 K/uL Final  . Eosinophils Relative 11/13/2020 2  % Final  . Eosinophils Absolute 11/13/2020 0.2  0.0 - 0.5 K/uL Final  . Basophils Relative 11/13/2020 1  % Final  . Basophils Absolute 11/13/2020 0.0  0.0 - 0.1 K/uL Final  . Immature Granulocytes 11/13/2020 0  % Final  . Abs Immature Granulocytes 11/13/2020 0.03  0.00 - 0.07 K/uL Final   Performed at [facility], [address], [place], [place] [address]   Preoperation CA 27-29 is normal 28.6  Assessment:  Holly Henderson is a 66 y.o. female with left breast cancer s/p biopsy on 11/05/2020.  Pathology revealed a 6 mm, grade I, invasive lobular carcinoma, classic type.  ER/PR positive, HER2 negative 1. Malignant neoplasm of overlapping sites of left breast in female, estrogen receptor positive ([place])   2. Goals of care, counseling/discussion    Cancer Staging Malignant neoplasm of overlapping sites of left breast in female, estrogen receptor positive ([place]) Staging form: Breast, AJCC 8th Edition - Clinical stage from 12/21/2020: Stage Unknown (cTX, cN0, cM0, G2, ER+, PR+, HER2-) - Signed by Earlie Server, MD on 12/22/2020     # Invasive lobular left breast cancer, ER/PR +, HER2 - I reviewed with the patient about MRI image findings. Biopsy results of biopsies were discussed  in details. Tumor board consensus decision was also discussed.  Recommend surgical resection. She has establish care with Dr. Oletta Darter. Dillingham and she has decided to proceed with bilateral oncoplastic reduction. Plan to send Oncotype DX recurrence score Discussed about adjuvant radiation and adjuvant antiestrogen therapy rationale and potential side effects.  Recommend baseline DEXA.  Recommend over-the-counter calcium and  vitamin D limitation. Goals of care, curative intent  All questions were answered to patient's satisfaction.   RTC 2 weeks after surgery for MD assessment.   Earlie Server, MD, PhD Hematology Oncology [place] at [facility] 12/22/2020

## 2020-12-25 ENCOUNTER — Ambulatory Visit (INDEPENDENT_AMBULATORY_CARE_PROVIDER_SITE_OTHER): Payer: BC Managed Care – PPO | Admitting: Surgical

## 2020-12-25 ENCOUNTER — Other Ambulatory Visit: Payer: Self-pay

## 2020-12-25 ENCOUNTER — Encounter: Payer: Self-pay | Admitting: Surgical

## 2020-12-25 DIAGNOSIS — Z170 Estrogen receptor positive status [ER+]: Secondary | ICD-10-CM

## 2020-12-25 DIAGNOSIS — C50412 Malignant neoplasm of upper-outer quadrant of left female breast: Secondary | ICD-10-CM

## 2020-12-25 MED ORDER — CEPHALEXIN 500 MG PO CAPS: 500.0000 mg | ORAL_CAPSULE | Freq: Four times a day (QID) | ORAL | 0 refills | Status: AC

## 2020-12-25 MED ORDER — ONDANSETRON HCL 4 MG PO TABS: 4.0000 mg | ORAL_TABLET | Freq: Three times a day (TID) | ORAL | 0 refills | Status: DC | PRN

## 2020-12-25 MED ORDER — HYDROCODONE-ACETAMINOPHEN 5-325 MG PO TABS: 1.0000 | ORAL_TABLET | Freq: Four times a day (QID) | ORAL | 0 refills | Status: AC | PRN

## 2020-12-25 NOTE — [place] Notes (Signed)
   Patient ID: Holly Henderson, female    DOB: 05/14/1956, 64 y.o.   MRN: [id number]  Chief Complaint  Patient presents with  . Pre-op Exam      ICD-10-CM   1. Malignant neoplasm of upper-outer quadrant of left breast in female, estrogen receptor positive ([facility])  C50.412    Z17.0     History of Present Illness: Holly Henderson is a 64 y.o.  female  with a recent diagnosis of left-sided lobular carcinoma.  She presents for preoperative evaluation for upcoming procedure, left partial mastectomy with radiofrequency tag by Dr. Sakai with general surgery followed by bilateral oncoplastic breast reduction scheduled for 01/02/2021 with Dr. Dillingham.  The patient has not had problems with anesthesia. No history of DVT/PE.  No family history of DVT/PE.  No family or personal history of bleeding or clotting disorders.  Patient is not currently taking any blood thinners.  No history of CVA/MI.   Summary of Previous Visit: Patient diagnosed with left breast lobular carcinoma.  Pathology showed estrogen and progesterone positive and HER2 negative.  She is a diabetic.  She is a history of hypertension and sleep apnea as well.  Her preoperative bra size is a DD cup. She is scheduled for postoperative radiation.  PMH Significant for: Hypertension, diabetes mellitus, sleep apnea Her most recent A1c was 2 months ago.  This was 13.2.  The patient gave consent to have this visit done by telemedicine / virtual visit, two identifiers were used to identify patient. This is also consent for access the chart and treat the patient via this visit. The patient is located at work, on break.  I, the provider, am at the office.  We spent 18 minutes together for the visit.  Joined by telephone.   Past Medical History: Allergies: Allergies  Allergen Reactions  . Latex Other (See Comments)    Irritation/gyn    Current Medications:  Current Outpatient Medications:  .  Dulaglutide 0.75 MG/0.5ML SOPN,  Inject 0.75 mg into the skin every Monday., Disp: , Rfl:  .  losartan (COZAAR) 50 MG tablet, Take 50 mg by mouth daily., Disp: , Rfl:  .  metFORMIN (GLUCOPHAGE) 1000 MG tablet, Take 1,000 mg by mouth 2 (two) times daily with a meal., Disp: , Rfl:  .  Multiple Vitamins-Minerals (MULTIVITAMIN WITH MINERALS) tablet, Take 1 tablet by mouth daily., Disp: , Rfl:  .  spironolactone (ALDACTONE) 25 MG tablet, Take 25 mg by mouth daily., Disp: , Rfl:   Past Medical Problems: Past Medical History:  Diagnosis Date  . Diabetes mellitus without complication (H[facility])   . Hypertension   . Malignant neoplasm of upper-outer quadrant of left female breast ([facility]) 11/13/2020    Past Surgical History: Past Surgical History:  Procedure Laterality Date  . BREAST BIOPSY Left 11/05/2020   stereo bx, x-clip, path pending   . CHOLECYSTECTOMY    . FRACTURE SURGERY     left - right - left  . TOTAL KNEE ARTHROPLASTY Left 01/26/2020   Procedure: TOTAL KNEE ARTHROPLASTY;  Surgeon: Poggi, John J, MD;  Location: [facility] ORS;  Service: Orthopedics;  Laterality: Left;    Social History: Social History   Socioeconomic History  . Marital status: Divorced    Spouse name: Not on file  . Number of children: Not on file  . Years of education: Not on file  . Highest education level: Not on file  Occupational History  . Not on file  Tobacco Use  . Smoking   status: Never Smoker  . Smokeless tobacco: Never Used  Vaping Use  . Vaping Use: Never used  Substance and Sexual Activity  . Alcohol use: Yes    Comment: occasional  . Drug use: Never  . Sexual activity: Not on file  Other Topics Concern  . Not on file  Social History Narrative  . Not on file   Social Determinants of Health   Financial Resource Strain: Not on file  Food Insecurity: Not on file  Transportation Needs: Not on file  Physical Activity: Not on file  Stress: Not on file  Social Connections: Not on file  Intimate Partner Violence: Not on file     Family History: Family History  Problem Relation Age of Onset  . Breast cancer Mother 75    Review of Systems: ROS  Physical Exam: Vital Signs There were no vitals taken for this visit. Physical Exam  Psychiatric:        Mood and Affect: Mood normal.        Behavior: Behavior normal.    Assessment/Plan: The patient is scheduled for left partial mastectomy with general surgery followed by bilateral oncoplastic breast reduction with Dr. Dillingham.  Risks, benefits, and alternatives of procedure discussed, questions answered and consent obtained.    Smoking Status: Non-smoker; Counseling Given?  N/A  Caprini Score: 8, high; Risk Factors include: Age, BMI greater than 40, left breast cancer, and length of planned surgery. Recommendation for mechanical and pharmacological prophylaxis for 7 to 10 days postoperatively. Encourage early ambulation.   Pictures obtained: 12/12/2020  Post-op Rx sent to pharmacy: Norco, Zofran, Keflex  We thoroughly discussed that patient is at a significantly increased risk of postoperative complications given her A1c was 13.2 2 months ago.  We discussed that she is at an increased risk of incisional dehiscence, postoperative infections, need for additional procedures, significant breakdown of her skin resulting in large wounds.  The risk that can be encountered with breast reduction were discussed and include the following but not limited to these:  Breast asymmetry, fluid accumulation, firmness of the breast, inability to breast feed, loss of nipple or areola, skin loss, decrease or no nipple sensation, fat necrosis of the breast tissue, bleeding, infection, healing delay.  There are risks of anesthesia, changes to skin sensation and injury to nerves or blood vessels.  The muscle can be temporarily or permanently injured.  You may have an allergic reaction to tape, suture, glue, blood products which can result in skin discoloration, swelling, pain, skin  lesions, poor healing.  Any of these can lead to the need for revisonal surgery or stage procedures.  A reduction has potential to interfere with diagnostic procedures.  Nipple or breast piercing can increase risks of infection.  This procedure is best done when the breast is fully developed.  Changes in the breast will continue to occur over time.  Pregnancy can alter the outcomes of previous breast reduction surgery, weight gain and weigh loss can also effect the long term appearance.   Electronically signed by: Mackenna Kamer J Trinette Vera, PA-C 12/25/2020 12:14 PM 

## 2020-12-25 NOTE — H&P (View-Only) (Signed)
Patient ID: Holly Henderson, female    DOB: 1955-10-15, 65 y.o.   MRN: [id number]  Chief Complaint  Patient presents with  . Pre-op Exam      ICD-10-CM   1. Malignant neoplasm of upper-outer quadrant of left breast in female, estrogen receptor positive ([place])  C50.412    Z17.0     History of Present Illness: Holly Henderson is a 65 y.o.  female  with a recent diagnosis of left-sided lobular carcinoma.  She presents for preoperative evaluation for upcoming procedure, left partial mastectomy with radiofrequency tag by Dr. Lysle Pearl with general surgery followed by bilateral oncoplastic breast reduction scheduled for 01/02/2021 with Dr. Marla Roe.  The patient has not had problems with anesthesia. No history of DVT/PE.  No family history of DVT/PE.  No family or personal history of bleeding or clotting disorders.  Patient is not currently taking any blood thinners.  No history of CVA/MI.   Summary of Previous Visit: Patient diagnosed with left breast lobular carcinoma.  Pathology showed estrogen and progesterone positive and HER2 negative.  She is a diabetic.  She is a history of hypertension and sleep apnea as well.  Her preoperative bra size is a DD cup. She is scheduled for postoperative radiation.  PMH Significant for: Hypertension, diabetes mellitus, sleep apnea Her most recent A1c was 2 months ago.  This was 13.2.  The patient gave consent to have this visit done by telemedicine / virtual visit, two identifiers were used to identify patient. This is also consent for access the chart and treat the patient via this visit. The patient is located at work, on break.  I, the provider, am at the office.  We spent 18 minutes together for the visit.  Joined by telephone.   Past Medical History: Allergies: Allergies  Allergen Reactions  . Latex Other (See Comments)    Irritation/gyn    Current Medications:  Current Outpatient Medications:  .  Dulaglutide 0.75 MG/0.5ML SOPN,  Inject 0.75 mg into the skin every Monday., Disp: , Rfl:  .  losartan (COZAAR) 50 MG tablet, Take 50 mg by mouth daily., Disp: , Rfl:  .  metFORMIN (GLUCOPHAGE) 1000 MG tablet, Take 1,000 mg by mouth 2 (two) times daily with a meal., Disp: , Rfl:  .  Multiple Vitamins-Minerals (MULTIVITAMIN WITH MINERALS) tablet, Take 1 tablet by mouth daily., Disp: , Rfl:  .  spironolactone (ALDACTONE) 25 MG tablet, Take 25 mg by mouth daily., Disp: , Rfl:   Past Medical Problems: Past Medical History:  Diagnosis Date  . Diabetes mellitus without complication ([place])   . Hypertension   . Malignant neoplasm of upper-outer quadrant of left female breast ([place]) 11/13/2020    Past Surgical History: Past Surgical History:  Procedure Laterality Date  . BREAST BIOPSY Left 11/05/2020   stereo bx, x-clip, path pending   . CHOLECYSTECTOMY    . FRACTURE SURGERY     left - right - left  . TOTAL KNEE ARTHROPLASTY Left 01/26/2020   Procedure: TOTAL KNEE ARTHROPLASTY;  Surgeon: Corky Mull, MD;  Location: [facility] ORS;  Service: Orthopedics;  Laterality: Left;    Social History: Social History   Socioeconomic History  . Marital status: Divorced    Spouse name: Not on file  . Number of children: Not on file  . Years of education: Not on file  . Highest education level: Not on file  Occupational History  . Not on file  Tobacco Use  . Smoking  status: Never Smoker  . Smokeless tobacco: Never Used  Vaping Use  . Vaping Use: Never used  Substance and Sexual Activity  . Alcohol use: Yes    Comment: occasional  . Drug use: Never  . Sexual activity: Not on file  Other Topics Concern  . Not on file  Social History Narrative  . Not on file   Social Determinants of Health   Financial Resource Strain: Not on file  Food Insecurity: Not on file  Transportation Needs: Not on file  Physical Activity: Not on file  Stress: Not on file  Social Connections: Not on file  Intimate Partner Violence: Not on file     Family History: Family History  Problem Relation Age of Onset  . Breast cancer Mother 78    Review of Systems: ROS  Physical Exam: Vital Signs There were no vitals taken for this visit. Physical Exam  Psychiatric:        Mood and Affect: Mood normal.        Behavior: Behavior normal.    Assessment/Plan: The patient is scheduled for left partial mastectomy with general surgery followed by bilateral oncoplastic breast reduction with Dr. Marla Roe.  Risks, benefits, and alternatives of procedure discussed, questions answered and consent obtained.    Smoking Status: Non-smoker; Counseling Given?  N/A  Caprini Score: 8, high; Risk Factors include: Age, BMI greater than 40, left breast cancer, and length of planned surgery. Recommendation for mechanical and pharmacological prophylaxis for 7 to 10 days postoperatively. Encourage early ambulation.   Pictures obtained: 12/12/2020  Post-op Rx sent to pharmacy: Norco, Zofran, Keflex  We thoroughly discussed that patient is at a significantly increased risk of postoperative complications given her X1G was 13.2 2 months ago.  We discussed that she is at an increased risk of incisional dehiscence, postoperative infections, need for additional procedures, significant breakdown of her skin resulting in large wounds.  The risk that can be encountered with breast reduction were discussed and include the following but not limited to these:  Breast asymmetry, fluid accumulation, firmness of the breast, inability to breast feed, loss of nipple or areola, skin loss, decrease or no nipple sensation, fat necrosis of the breast tissue, bleeding, infection, healing delay.  There are risks of anesthesia, changes to skin sensation and injury to nerves or blood vessels.  The muscle can be temporarily or permanently injured.  You may have an allergic reaction to tape, suture, glue, blood products which can result in skin discoloration, swelling, pain, skin  lesions, poor healing.  Any of these can lead to the need for revisonal surgery or stage procedures.  A reduction has potential to interfere with diagnostic procedures.  Nipple or breast piercing can increase risks of infection.  This procedure is best done when the breast is fully developed.  Changes in the breast will continue to occur over time.  Pregnancy can alter the outcomes of previous breast reduction surgery, weight gain and weigh loss can also effect the long term appearance.   Electronically signed by: Carola Rhine Danice Dippolito, PA-C 12/25/2020 12:14 PM

## 2020-12-26 ENCOUNTER — Other Ambulatory Visit: Payer: Self-pay

## 2020-12-26 ENCOUNTER — Other Ambulatory Visit
Admission: RE | Admit: 2020-12-26 | Discharge: 2020-12-26 | Disposition: A | Discharge status: Home / Self Care | Payer: BC Managed Care – PPO | Source: Ambulatory Visit | Attending: Surgery | Admitting: Surgery

## 2020-12-26 ENCOUNTER — Ambulatory Visit
Admission: RE | Admit: 2020-12-26 | Discharge: 2020-12-26 | Disposition: A | Discharge status: Home / Self Care | Payer: BC Managed Care – PPO | Source: Ambulatory Visit | Attending: Surgery | Admitting: Surgery

## 2020-12-26 DIAGNOSIS — C50912 Malignant neoplasm of unspecified site of left female breast: Secondary | ICD-10-CM

## 2020-12-26 NOTE — Patient Instructions (Addendum)
Your procedure is scheduled on: 01/02/21 - Wednesday Report to the Registration Desk on the 1st floor of the [facility]. To find out your arrival time, please call [phone] between 1PM - 3PM on: 01/01/21 - Tuesday  REMEMBER: Instructions that are not followed completely may result in serious medical risk, up to and including death; or upon the discretion of your surgeon and anesthesiologist your surgery may need to be rescheduled.  Do not eat food after midnight the night before surgery.  No gum chewing, lozengers or hard candies.  You may however, drink CLEAR liquids up to 2 hours before you are scheduled to arrive for your surgery. Do not drink anything within 2 hours of your scheduled arrival time.  Clear liquids include: - water, Type 1 and Type 2 diabetics should only drink water.  TAKE THESE MEDICATIONS THE MORNING OF SURGERY WITH A SIP OF WATER:   - May Take Tylenol as directed if needed.  Stop Metformin  2 days prior to surgery. Do not take 05/09, 05/10 of the morning of surgery.  One week prior to surgery: Stop Anti-inflammatories (NSAIDS) such as Advil, Aleve, Ibuprofen, Motrin, Naproxen, Naprosyn and Aspirin based products such as Excedrin, Goodys Powder, BC Powder.  Stop ANY OVER THE COUNTER supplements until after surgery.  No Alcohol for 24 hours before or after surgery.  No Smoking including e-cigarettes for 24 hours prior to surgery.  No chewable tobacco products for at least 6 hours prior to surgery.  No nicotine patches on the day of surgery.  Do not use any "recreational" drugs for at least a week prior to your surgery.  Please be advised that the combination of cocaine and anesthesia may have negative outcomes, up to and including death. If you test positive for cocaine, your surgery will be cancelled.  On the morning of surgery brush your teeth with toothpaste and water, you may rinse your mouth with mouthwash if you wish. Do not swallow any  toothpaste or mouthwash.  Do not wear jewelry, make-up, hairpins, clips or nail polish.  Do not wear lotions, powders, or perfumes.   Do not shave body from the neck down 48 hours prior to surgery just in case you cut yourself which could leave a site for infection.  Also, freshly shaved skin may become irritated if using the CHG soap.  Contact lenses, hearing aids and dentures may not be worn into surgery.  Do not bring valuables to the hospital. [facility] is not responsible for any missing/lost belongings or valuables.   Use CHG Soap or wipes as directed on instruction sheet.  Notify your doctor if there is any change in your medical condition (cold, fever, infection).  Wear comfortable clothing (specific to your surgery type) to the hospital.  Plan for stool softeners for home use; pain medications have a tendency to cause constipation. You can also help prevent constipation by eating foods high in fiber such as fruits and vegetables and drinking plenty of fluids as your diet allows.  After surgery, you can help prevent lung complications by doing breathing exercises.  Take deep breaths and cough every 1-2 hours. Your doctor may order a device called an Incentive Spirometer to help you take deep breaths. When coughing or sneezing, hold a pillow firmly against your incision with both hands. This is called "splinting." Doing this helps protect your incision. It also decreases belly discomfort.  If you are being admitted to the hospital overnight, leave your suitcase in the car. After  surgery it may be brought to your room.  If you are being discharged the day of surgery, you will not be allowed to drive home. You will need a responsible adult (18 years or older) to drive you home and stay with you that night.   If you are taking public transportation, you will need to have a responsible adult (18 years or older) with you. Please confirm with your physician that it is acceptable to  use public transportation.   Please call the [facility] Dept. at [phone] if you have any questions about these instructions.  Surgery Visitation Policy:  Patients undergoing a surgery or procedure may have one family member or support person with them as long as that person is not COVID-19 positive or experiencing its symptoms.  That person may remain in the waiting area during the procedure.  Inpatient Visitation:    Visiting hours are 7 a.m. to 8 p.m. Inpatients will be allowed two visitors daily. The visitors may change each day during the patient's stay. No visitors under the age of 77. Any visitor under the age of 68 must be accompanied by an adult. The visitor must pass COVID-19 screenings, use hand sanitizer when entering and exiting the patient's room and wear a mask at all times, including in the patient's room. Patients must also wear a mask when staff or their visitor are in the room. Masking is required regardless of vaccination status.

## 2020-12-26 NOTE — Progress Notes (Signed)
  Perioperative Services: Pre-Admission/Anesthesia Testing  Abnormal Lab Notification    Date: 12/26/20  Name: Holly Henderson MRN:   [id number]  Re: Abnormal labs noted during PAT appointment   Provider(s) Notified: Benjamine Sprague, DO Notification mode: Routed and/or faxed via [facility]   ABNORMAL LAB VALUE(S): Lab Results  Component Value Date   GLUCOSE 120 (H) 11/13/2020     Notes: Patient with a T2DM diagnosis. She is currently on both oral (metformin) and parenteral (dulaglutide) therapies. Patient newly diagnosed with T2DM in 09/2020. Hgb A1c was 13.2% on 10/17/2020.  Patient recently started on medications for her diabetes in 10/2020. Patient was considering bariatric surgery to help with weight loss, new onset DM, and overall health when she was diagnosed with invasive lobular breast cancer. Given the uncertainty of her oncology diagnosis, evaluation for bariatric surgery has been postponed.   Patient with new oncology diagnosis. Undetermined prognosis limits our ability to better optimize patient's blood glucose levels prior to her breast procedure. The risks of deferring outweigh the associated risks. This communication is being sent to ensure that surgeon is aware of diabetes diagnosis and current management.   This is a Community education officer; no formal response is required.  Honor Loh, MSN, APRN, FNP-C, CEN [facility]  Peri-operative Services Nurse Practitioner Phone: [phone] 12/26/20 2:34 PM

## 2020-12-28 ENCOUNTER — Telehealth: Payer: Self-pay

## 2020-12-28 NOTE — Telephone Encounter (Signed)
Received FMLA forms from [place], but pt has no treatment plan currently, so we are unable to fill out. She will be having breast surgery next week and most likely will need it for that reason. Per [place] conversation from Dr Tasia Catchings, Dr. Lysle Pearl agreed to sign forms. Forms Faxed to Dr. Ines Bloomer office on 12/27/20 for them to fill out.

## 2020-12-31 ENCOUNTER — Other Ambulatory Visit: Payer: BC Managed Care – PPO

## 2021-01-01 MED ORDER — CEFAZOLIN SODIUM-DEXTROSE 2-4 GM/100ML-% IV SOLN
2.0000 g | INTRAVENOUS | Status: AC
  Administered 2021-01-02: 2 g via INTRAVENOUS

## 2021-01-01 MED ORDER — CHLORHEXIDINE GLUCONATE CLOTH 2 % EX PADS: 6.0000 | MEDICATED_PAD | Freq: Once | CUTANEOUS | Status: DC

## 2021-01-01 MED ORDER — CHLORHEXIDINE GLUCONATE 0.12 % MT SOLN: 15.0000 mL | Freq: Once | OROMUCOSAL | Status: AC

## 2021-01-01 MED ORDER — FAMOTIDINE 20 MG PO TABS: 20.0000 mg | ORAL_TABLET | Freq: Once | ORAL | Status: AC

## 2021-01-01 MED ORDER — ORAL CARE MOUTH RINSE: 15.0000 mL | Freq: Once | OROMUCOSAL | Status: AC

## 2021-01-01 MED ORDER — SODIUM CHLORIDE 0.9 % IV SOLN
INTRAVENOUS | Status: DC
Start: 2021-01-01 — End: 2021-01-02

## 2021-01-01 MED ORDER — CEFAZOLIN SODIUM-DEXTROSE 2-4 GM/100ML-% IV SOLN: 2.0000 g | INTRAVENOUS | Status: DC

## 2021-01-02 ENCOUNTER — Ambulatory Visit: Payer: BC Managed Care – PPO | Admitting: Anesthesiology

## 2021-01-02 ENCOUNTER — Ambulatory Visit
Admission: RE | Admit: 2021-01-02 | Discharge: 2021-01-02 | Disposition: A | Discharge status: Home / Self Care | Payer: BC Managed Care – PPO | Source: Ambulatory Visit | Attending: Surgery | Admitting: Surgery

## 2021-01-02 ENCOUNTER — Other Ambulatory Visit: Payer: Self-pay

## 2021-01-02 ENCOUNTER — Encounter: Payer: Self-pay | Admitting: Surgery

## 2021-01-02 ENCOUNTER — Encounter
Admission: RE | Disposition: A | Discharge status: Home / Self Care | Payer: Self-pay | Source: Ambulatory Visit | Attending: Surgery

## 2021-01-02 DIAGNOSIS — C50812 Malignant neoplasm of overlapping sites of left female breast: Secondary | ICD-10-CM

## 2021-01-02 DIAGNOSIS — Z8349 Family history of other endocrine, nutritional and metabolic diseases: Secondary | ICD-10-CM | POA: Diagnosis not present

## 2021-01-02 DIAGNOSIS — Z803 Family history of malignant neoplasm of breast: Secondary | ICD-10-CM | POA: Insufficient documentation

## 2021-01-02 DIAGNOSIS — Z823 Family history of stroke: Secondary | ICD-10-CM | POA: Insufficient documentation

## 2021-01-02 DIAGNOSIS — Z8249 Family history of ischemic heart disease and other diseases of the circulatory system: Secondary | ICD-10-CM | POA: Insufficient documentation

## 2021-01-02 DIAGNOSIS — Z91040 Latex allergy status: Secondary | ICD-10-CM | POA: Insufficient documentation

## 2021-01-02 DIAGNOSIS — Z79899 Other long term (current) drug therapy: Secondary | ICD-10-CM | POA: Diagnosis not present

## 2021-01-02 DIAGNOSIS — E119 Type 2 diabetes mellitus without complications: Secondary | ICD-10-CM | POA: Insufficient documentation

## 2021-01-02 DIAGNOSIS — Z170 Estrogen receptor positive status [ER+]: Secondary | ICD-10-CM | POA: Insufficient documentation

## 2021-01-02 DIAGNOSIS — C50012 Malignant neoplasm of nipple and areola, left female breast: Secondary | ICD-10-CM

## 2021-01-02 DIAGNOSIS — Z7984 Long term (current) use of oral hypoglycemic drugs: Secondary | ICD-10-CM | POA: Diagnosis not present

## 2021-01-02 DIAGNOSIS — Z801 Family history of malignant neoplasm of trachea, bronchus and lung: Secondary | ICD-10-CM | POA: Diagnosis not present

## 2021-01-02 DIAGNOSIS — Z83511 Family history of glaucoma: Secondary | ICD-10-CM | POA: Diagnosis not present

## 2021-01-02 DIAGNOSIS — G4730 Sleep apnea, unspecified: Secondary | ICD-10-CM | POA: Diagnosis not present

## 2021-01-02 DIAGNOSIS — I10 Essential (primary) hypertension: Secondary | ICD-10-CM | POA: Insufficient documentation

## 2021-01-02 DIAGNOSIS — C50412 Malignant neoplasm of upper-outer quadrant of left female breast: Secondary | ICD-10-CM

## 2021-01-02 DIAGNOSIS — C50912 Malignant neoplasm of unspecified site of left female breast: Secondary | ICD-10-CM | POA: Diagnosis present

## 2021-01-02 HISTORY — PX: PART MASTECTOMY,RADIO FREQUENCY LOCALIZER,AXILLARY SENTINEL NODE BIOPSY: SHX6901

## 2021-01-02 HISTORY — PX: BREAST REDUCTION SURGERY: SHX8

## 2021-01-02 HISTORY — PX: BREAST LUMPECTOMY: SHX2

## 2021-01-02 LAB — GLUCOSE, CAPILLARY
Glucose-Capillary: 96 mg/dL (ref 70–99)
Glucose-Capillary: 86 mg/dL (ref 70–99)

## 2021-01-02 SURGERY — PART MASTECTOMY,RADIO FREQUENCY LOCALIZER,AXILLARY SENTINEL NODE BIOPSY
Anesthesia: General | Site: Breast | Laterality: Left

## 2021-01-02 MED ORDER — DOCUSATE SODIUM 100 MG PO CAPS: 100.0000 mg | ORAL_CAPSULE | Freq: Two times a day (BID) | ORAL | 0 refills | Status: AC | PRN

## 2021-01-02 MED ORDER — FENTANYL CITRATE (PF) 100 MCG/2ML IJ SOLN
INTRAMUSCULAR | Status: AC
  Filled 2021-01-02: qty 2

## 2021-01-02 MED ORDER — LIDOCAINE HCL (CARDIAC) PF 100 MG/5ML IV SOSY
PREFILLED_SYRINGE | INTRAVENOUS | Status: DC | PRN
  Administered 2021-01-02: 100 mg via INTRAVENOUS

## 2021-01-02 MED ORDER — LIDOCAINE HCL (PF) 1 % IJ SOLN
INTRAMUSCULAR | Status: AC
  Filled 2021-01-02: qty 30

## 2021-01-02 MED ORDER — PHENYLEPHRINE HCL (PRESSORS) 10 MG/ML IV SOLN
INTRAVENOUS | Status: DC | PRN
  Administered 2021-01-02 (×2): 100 ug via INTRAVENOUS
  Administered 2021-01-02: 200 ug via INTRAVENOUS
  Administered 2021-01-02 (×3): 100 ug via INTRAVENOUS

## 2021-01-02 MED ORDER — LIDOCAINE HCL 1 % IJ SOLN
INTRAMUSCULAR | Status: DC | PRN
  Administered 2021-01-02: 30 mL

## 2021-01-02 MED ORDER — ROCURONIUM BROMIDE 100 MG/10ML IV SOLN
INTRAVENOUS | Status: DC | PRN
  Administered 2021-01-02: 80 mg via INTRAVENOUS
  Administered 2021-01-02: 20 mg via INTRAVENOUS

## 2021-01-02 MED ORDER — ACETAMINOPHEN 325 MG PO TABS: 650.0000 mg | ORAL_TABLET | Freq: Three times a day (TID) | ORAL | 0 refills | Status: DC | PRN

## 2021-01-02 MED ORDER — TECHNETIUM TC 99M TILMANOCEPT KIT
1.1700 | PACK | Freq: Once | INTRAVENOUS | Status: AC | PRN
  Administered 2021-01-02: 1.17 via INTRADERMAL

## 2021-01-02 MED ORDER — CEFAZOLIN SODIUM-DEXTROSE 2-4 GM/100ML-% IV SOLN
INTRAVENOUS | Status: AC
  Filled 2021-01-02: qty 100

## 2021-01-02 MED ORDER — LIDOCAINE-EPINEPHRINE 1 %-1:100000 IJ SOLN
INTRAMUSCULAR | Status: AC
  Filled 2021-01-02: qty 1

## 2021-01-02 MED ORDER — OXYCODONE HCL 5 MG PO TABS
ORAL_TABLET | ORAL | Status: AC
  Filled 2021-01-02: qty 1

## 2021-01-02 MED ORDER — LACTATED RINGERS IV SOLN: INTRAVENOUS | Status: DC | PRN

## 2021-01-02 MED ORDER — FAMOTIDINE 20 MG PO TABS
ORAL_TABLET | ORAL | Status: AC
  Administered 2021-01-02: 20 mg via ORAL
  Filled 2021-01-02: qty 1

## 2021-01-02 MED ORDER — OXYCODONE HCL 5 MG PO TABS
5.0000 mg | ORAL_TABLET | Freq: Once | ORAL | Status: AC | PRN
  Administered 2021-01-02: 5 mg via ORAL

## 2021-01-02 MED ORDER — MIDAZOLAM HCL 2 MG/2ML IJ SOLN
INTRAMUSCULAR | Status: AC
  Filled 2021-01-02: qty 2

## 2021-01-02 MED ORDER — LIDOCAINE-EPINEPHRINE (PF) 1 %-1:200000 IJ SOLN
INTRAMUSCULAR | Status: DC | PRN
  Administered 2021-01-02: 10 mL

## 2021-01-02 MED ORDER — MIDAZOLAM HCL 2 MG/2ML IJ SOLN
INTRAMUSCULAR | Status: DC | PRN
  Administered 2021-01-02: 2 mg via INTRAVENOUS

## 2021-01-02 MED ORDER — FENTANYL CITRATE (PF) 100 MCG/2ML IJ SOLN
INTRAMUSCULAR | Status: AC
  Administered 2021-01-02: 25 ug via INTRAVENOUS
  Filled 2021-01-02: qty 2

## 2021-01-02 MED ORDER — BUPIVACAINE-EPINEPHRINE 0.5% -1:200000 IJ SOLN
INTRAMUSCULAR | Status: DC | PRN
  Administered 2021-01-02: 30 mL

## 2021-01-02 MED ORDER — ONDANSETRON HCL 4 MG/2ML IJ SOLN: 4.0000 mg | Freq: Once | INTRAMUSCULAR | Status: DC | PRN

## 2021-01-02 MED ORDER — SUGAMMADEX SODIUM 200 MG/2ML IV SOLN
INTRAVENOUS | Status: DC | PRN
  Administered 2021-01-02: 200 mg via INTRAVENOUS

## 2021-01-02 MED ORDER — CHLORHEXIDINE GLUCONATE 0.12 % MT SOLN
OROMUCOSAL | Status: AC
  Administered 2021-01-02: 15 mL via OROMUCOSAL
  Filled 2021-01-02: qty 15

## 2021-01-02 MED ORDER — IBUPROFEN 800 MG PO TABS: 800.0000 mg | ORAL_TABLET | Freq: Three times a day (TID) | ORAL | 0 refills | Status: DC | PRN

## 2021-01-02 MED ORDER — OXYCODONE HCL 5 MG/5ML PO SOLN: 5.0000 mg | Freq: Once | ORAL | Status: AC | PRN

## 2021-01-02 MED ORDER — PROPOFOL 10 MG/ML IV BOLUS
INTRAVENOUS | Status: DC | PRN
  Administered 2021-01-02: 200 mg via INTRAVENOUS

## 2021-01-02 MED ORDER — CEFAZOLIN SODIUM-DEXTROSE 1-4 GM/50ML-% IV SOLN
INTRAVENOUS | Status: DC | PRN
  Administered 2021-01-02: 1 g via INTRAVENOUS

## 2021-01-02 MED ORDER — BUPIVACAINE-EPINEPHRINE (PF) 0.5% -1:200000 IJ SOLN
INTRAMUSCULAR | Status: AC
  Filled 2021-01-02: qty 30

## 2021-01-02 MED ORDER — FENTANYL CITRATE (PF) 100 MCG/2ML IJ SOLN
25.0000 ug | INTRAMUSCULAR | Status: DC | PRN
  Administered 2021-01-02: 25 ug via INTRAVENOUS

## 2021-01-02 MED ORDER — ONDANSETRON HCL 4 MG/2ML IJ SOLN
INTRAMUSCULAR | Status: DC | PRN
  Administered 2021-01-02: 4 mg via INTRAVENOUS

## 2021-01-02 MED ORDER — HYDROCODONE-ACETAMINOPHEN 5-325 MG PO TABS: 1.0000 | ORAL_TABLET | Freq: Four times a day (QID) | ORAL | 0 refills | Status: DC | PRN

## 2021-01-02 MED ORDER — FENTANYL CITRATE (PF) 100 MCG/2ML IJ SOLN
INTRAMUSCULAR | Status: DC | PRN
  Administered 2021-01-02 (×4): 50 ug via INTRAVENOUS

## 2021-01-02 SURGICAL SUPPLY — 83 items
ADH SKN CLS APL DERMABOND .7 (GAUZE/BANDAGES/DRESSINGS) ×6
APL PRP STRL LF DISP 70% ISPRP (MISCELLANEOUS) ×2
APPLIER CLIP 11 MED OPEN (CLIP)
APR CLP MED 11 20 MLT OPN (CLIP)
BINDER BREAST LRG (GAUZE/BANDAGES/DRESSINGS) IMPLANT
BINDER BREAST MEDIUM (GAUZE/BANDAGES/DRESSINGS) IMPLANT
BINDER BREAST XLRG (GAUZE/BANDAGES/DRESSINGS) IMPLANT
BINDER BREAST XXLRG (GAUZE/BANDAGES/DRESSINGS) IMPLANT
BIOPATCH RED 1 DISK 7.0 (GAUZE/BANDAGES/DRESSINGS) IMPLANT
BLADE BOVIE TIP EXT 4 (BLADE) ×3 IMPLANT
BLADE SURG 15 STRL LF DISP TIS (BLADE) ×4 IMPLANT
BLADE SURG 15 STRL SS (BLADE) ×6
BLADE SURG SZ10 CARB STEEL (BLADE) ×3 IMPLANT
BULB RESERV EVAC DRAIN JP 100C (MISCELLANEOUS) IMPLANT
CANISTER SUCT 1200ML W/VALVE (MISCELLANEOUS) ×6 IMPLANT
CHLORAPREP W/TINT 26 (MISCELLANEOUS) ×3 IMPLANT
CLIP APPLIE 11 MED OPEN (CLIP) IMPLANT
CNTNR SPEC 2.5X3XGRAD LEK (MISCELLANEOUS)
CONT SPEC 4OZ STER OR WHT (MISCELLANEOUS)
CONT SPEC 4OZ STRL OR WHT (MISCELLANEOUS)
CONTAINER SPEC 2.5X3XGRAD LEK (MISCELLANEOUS) IMPLANT
COVER WAND RF STERILE (DRAPES) ×3 IMPLANT
DECANTER SPIKE VIAL GLASS SM (MISCELLANEOUS) IMPLANT
DERMABOND ADVANCED (GAUZE/BANDAGES/DRESSINGS) ×3
DERMABOND ADVANCED .7 DNX12 (GAUZE/BANDAGES/DRESSINGS) ×6 IMPLANT
DEVICE DSSCT PLSMBLD 3.0S LGHT (MISCELLANEOUS) ×2 IMPLANT
DEVICE DUBIN SPECIMEN MAMMOGRA (MISCELLANEOUS) ×3 IMPLANT
DRAIN CHANNEL 19F RND (DRAIN) IMPLANT
DRAPE CHEST BREAST 77X106 FENE (MISCELLANEOUS) ×3 IMPLANT
DRAPE LAPAROTOMY 77X122 PED (DRAPES) ×3 IMPLANT
DRSG OPSITE POSTOP 4X6 (GAUZE/BANDAGES/DRESSINGS) ×6 IMPLANT
ELECT CAUTERY BLADE TIP 2.5 (TIP) ×3
ELECT REM PT RETURN 9FT ADLT (ELECTROSURGICAL) ×9
ELECTRODE CAUTERY BLDE TIP 2.5 (TIP) ×2 IMPLANT
ELECTRODE REM PT RTRN 9FT ADLT (ELECTROSURGICAL) ×6 IMPLANT
GAUZE SPONGE 4X4 12PLY STRL (GAUZE/BANDAGES/DRESSINGS) ×3 IMPLANT
GLOVE SURG ENC MOIS LTX SZ6.5 (GLOVE) ×12 IMPLANT
GLOVE SURG SYN 6.5 ES PF (GLOVE) ×6 IMPLANT
GLOVE SURG UNDER POLY LF SZ7 (GLOVE) ×3 IMPLANT
GOWN STRL REUS W/ TWL LRG LVL3 (GOWN DISPOSABLE) ×10 IMPLANT
GOWN STRL REUS W/TWL LRG LVL3 (GOWN DISPOSABLE) ×15
KIT MARKER MARGIN INK (KITS) IMPLANT
KIT TURNOVER KIT A (KITS) ×3 IMPLANT
LABEL OR SOLS (LABEL) ×3 IMPLANT
LIGHT WAVEGUIDE WIDE FLAT (MISCELLANEOUS) IMPLANT
MANIFOLD NEPTUNE II (INSTRUMENTS) ×6 IMPLANT
MARKER MARGIN CORRECT CLIP (MARKER) IMPLANT
NEEDLE FILTER BLUNT 18X 1/2SAF (NEEDLE) ×1
NEEDLE FILTER BLUNT 18X1 1/2 (NEEDLE) ×2 IMPLANT
NEEDLE HYPO 22GX1.5 SAFETY (NEEDLE) ×9 IMPLANT
NS IRRIG 1000ML POUR BTL (IV SOLUTION) ×3 IMPLANT
PACK BASIN MAJOR ARMC (MISCELLANEOUS) ×3 IMPLANT
PACK BASIN MINOR ARMC (MISCELLANEOUS) ×3 IMPLANT
PAD ABD DERMACEA PRESS 5X9 (GAUZE/BANDAGES/DRESSINGS) ×6 IMPLANT
PLASMABLADE 3.0S W/LIGHT (MISCELLANEOUS) ×3
SET LOCALIZER 20 PROBE US (MISCELLANEOUS) ×3 IMPLANT
SOL PREP PVP 2OZ (MISCELLANEOUS) ×3
SOLUTION PREP PVP 2OZ (MISCELLANEOUS) ×2 IMPLANT
SPONGE LAP 18X18 RF (DISPOSABLE) ×6 IMPLANT
STRIP CLOSURE SKIN 1/2X4 (GAUZE/BANDAGES/DRESSINGS) ×15 IMPLANT
SUT MNCRL 3-0 UNDYED SH (SUTURE) ×8 IMPLANT
SUT MNCRL 4-0 (SUTURE) ×18
SUT MNCRL 4-0 27XMFL (SUTURE) ×12
SUT MNCRL+ 5-0 UNDYED PC-3 (SUTURE) ×4 IMPLANT
SUT MON AB 2-0 CT1 36 (SUTURE) ×6 IMPLANT
SUT MON AB 3-0 SH 27 (SUTURE) ×6 IMPLANT
SUT MON AB 5-0 P3 18 (SUTURE) ×6 IMPLANT
SUT MONOCRYL 3-0 UNDYED (SUTURE) ×4
SUT MONOCRYL 5-0 (SUTURE) ×2
SUT PDS 3-0 (SUTURE) ×1
SUT PDS AB 2-0 CT2 27 (SUTURE) IMPLANT
SUT PDS II 3-0 (SUTURE) ×6 IMPLANT
SUT PDS PLUS AB 3-0 PC-5 (SUTURE) ×2 IMPLANT
SUT SILK 3 0 12 30 (SUTURE) IMPLANT
SUT VIC AB 3-0 SH 27 (SUTURE) ×6
SUT VIC AB 3-0 SH 27X BRD (SUTURE) ×4 IMPLANT
SUTURE MNCRL 4-0 27XMF (SUTURE) ×12 IMPLANT
SYR 10ML LL (SYRINGE) ×6 IMPLANT
SYR 20ML LL LF (SYRINGE) ×3 IMPLANT
SYR BULB IRRIG 60ML STRL (SYRINGE) ×6 IMPLANT
TOWEL OR 17X26 4PK STRL BLUE (TOWEL DISPOSABLE) ×6 IMPLANT
TUBING CONNECTING 10 (TUBING) ×3 IMPLANT
WATER STERILE IRR 1000ML POUR (IV SOLUTION) ×3 IMPLANT

## 2021-01-02 NOTE — Interval H&P Note (Signed)
History and Physical Interval Note:  01/02/2021 12:56 PM  Holly Henderson  has presented today for surgery, with the diagnosis of C50.912 lobular carcinoma of lt breast.  The various methods of treatment have been discussed with the patient and family. After consideration of risks, benefits and other options for treatment, the patient has consented to  Procedure(s): PART [place] (Left) BILATERAL ONCOPLASTIC BREAST REDUCTION (Bilateral) as a surgical intervention.  The patient's history has been reviewed, patient examined, no change in status, stable for surgery.  I have reviewed the patient's chart and labs.  Questions were answered to the patient's satisfaction.     [name] [name]

## 2021-01-02 NOTE — Interval H&P Note (Signed)
History and Physical Interval Note:  01/02/2021 12:37 PM  Holly Henderson  has presented today for surgery, with the diagnosis of C50.912 lobular carcinoma of lt breast.  The various methods of treatment have been discussed with the patient and family. After consideration of risks, benefits and other options for treatment, the patient has consented to  Procedure(s): PART [place] (Left) BILATERAL ONCOPLASTIC BREAST REDUCTION (Bilateral) as a surgical intervention.  The patient's history has been reviewed, patient examined, no change in status, stable for surgery.  I have reviewed the patient's chart and labs.  Questions were answered to the patient's satisfaction.     Aalyah Mansouri Lysle Pearl

## 2021-01-02 NOTE — Anesthesia Preprocedure Evaluation (Signed)
Anesthesia Evaluation  Patient identified by MRN, date of birth, ID band Patient awake    Reviewed: Allergy & Precautions, H&P , NPO status , Patient's Chart, lab work & pertinent test results  History of Anesthesia Complications Negative for: history of anesthetic complications  Airway Mallampati: II  TM Distance: >3 FB Neck ROM: full    Dental  (+) Edentulous Upper   Pulmonary neg pulmonary ROS, neg sleep apnea, neg COPD,    breath sounds clear to auscultation       Cardiovascular hypertension, (-) angina(-) Past MI and (-) Cardiac Stents (-) dysrhythmias  Rhythm:regular Rate:Normal     Neuro/Psych negative neurological ROS  negative psych ROS   GI/Hepatic negative GI ROS, Neg liver ROS,   Endo/Other  diabetesMorbid obesity  Renal/GU      Musculoskeletal   Abdominal   Peds  Hematology negative hematology ROS (+)   Anesthesia Other Findings Past Medical History: No date: Diabetes mellitus without complication ([place]) No date: Hypertension 11/13/2020: Malignant neoplasm of upper-outer quadrant of left female  breast [facility])  Past Surgical History: 11/05/2020: BREAST BIOPSY; Left     Comment:  stereo bx, x-clip, path pending  No date: CHOLECYSTECTOMY No date: COLONOSCOPY No date: dental work No date: FRACTURE SURGERY     Comment:  left - right arm - left arm 01/26/2020: TOTAL KNEE ARTHROPLASTY; Left     Comment:  Procedure: TOTAL KNEE ARTHROPLASTY;  Surgeon: Corky Mull, MD;  Location: [facility] ORS;  Service: Orthopedics;                Laterality: Left;     Reproductive/Obstetrics negative OB ROS                            Anesthesia Physical Anesthesia Plan  ASA: III  Anesthesia Plan: General ETT   Post-op Pain Management:    Induction:   PONV Risk Score and Plan: Ondansetron, Dexamethasone, Midazolam and Treatment may vary due to age or medical  condition  Airway Management Planned:   Additional Equipment:   Intra-op Plan:   Post-operative Plan:   Informed Consent: I have reviewed the patients History and Physical, chart, labs and discussed the procedure including the risks, benefits and alternatives for the proposed anesthesia with the patient or authorized representative who has indicated his/her understanding and acceptance.     Dental Advisory Given  Plan Discussed with: Anesthesiologist, CRNA and Surgeon  Anesthesia Plan Comments:         Anesthesia Quick Evaluation

## 2021-01-02 NOTE — Transfer of Care (Signed)
Immediate Anesthesia Transfer of Care Note  Patient: Holly Henderson  Procedure(s) Performed: PART [place] SENTINEL NODE BIOPSY (Left Breast) BILATERAL ONCOPLASTIC BREAST REDUCTION (Bilateral )  Patient Location: PACU  Anesthesia Type:General  Level of Consciousness: awake, alert  and oriented  Airway & Oxygen Therapy: Patient Spontanous Breathing and Patient connected to face mask oxygen  Post-op Assessment: Report given to RN and Post -op Vital signs reviewed and stable  Post vital signs: Reviewed and stable  Last Vitals:  Vitals Value Taken Time  BP 171/82 01/02/21 1552  Temp    Pulse 77 01/02/21 1557  Resp 13 01/02/21 1557  SpO2 100 % 01/02/21 1557  Vitals shown include unvalidated device data.  Last Pain:  Vitals:   01/02/21 1126  TempSrc: Temporal  PainSc: 0-No pain         Complications: No complications documented.

## 2021-01-02 NOTE — Anesthesia Procedure Notes (Signed)
Procedure Name: MAC Date/Time: 01/02/2021 1:25 PM Performed by: Leander Rams, CRNA Pre-anesthesia Checklist: Patient identified, Emergency Drugs available, Suction available and Patient being monitored Patient Re-evaluated:Patient Re-evaluated prior to induction Oxygen Delivery Method: Circle system utilized Preoxygenation: Pre-oxygenation with 100% oxygen Induction Type: IV induction Ventilation: Mask ventilation without difficulty Laryngoscope Size: McGraph and 3 Grade View: Grade I Tube type: Oral Tube size: 7.0 mm Number of attempts: 1 Airway Equipment and Method: Stylet Placement Confirmation: ETT inserted through vocal cords under direct vision,  positive ETCO2,  CO2 detector and breath sounds checked- equal and bilateral Secured at: 19 cm Tube secured with: Tape Dental Injury: Teeth and Oropharynx as per pre-operative assessment

## 2021-01-02 NOTE — Discharge Instructions (Signed)
Removal, Care After This sheet gives you information about how to care for yourself after your procedure. Your health care provider may also give you more specific instructions. If you have problems or questions, contact your health care provider. What can I expect after the procedure? After the procedure, it is common to have:  Soreness.  Bruising.  Itching. Follow these instructions at home: site care Follow instructions from your health care provider about how to take care of your site. Make sure you:  Wash your hands with soap and water before and after you change your bandage (dressing). If soap and water are not available, use hand sanitizer.  Leave stitches (sutures), skin glue, or adhesive strips in place. These skin closures may need to stay in place for 2 weeks or longer. If adhesive strip edges start to loosen and curl up, you may trim the loose edges. Do not remove adhesive strips completely unless your health care provider tells you to do that.  If the area bleeds or bruises, apply gentle pressure for 10 minutes.  OK TO SHOWER IN 24HRS  Check your site every day for signs of infection. Check for:  Redness, swelling, or pain.  Fluid or blood.  Warmth.  Pus or a bad smell.  General instructions  Rest and then return to your normal activities as told by your health care provider.  Complete antibiotics as prescribed by Dr. Eusebio Friendly office .  tylenol and advil as needed for discomfort.  Please alternate between the two every four hours as needed for pain.   .  Use narcotics, if prescribed, only when tylenol and motrin is not enough to control pain. .  325-650mg  every 8hrs to max of 3000mg /24hrs (including the 325mg  in every norco dose) for the tylenol.   .  Advil up to 800mg  per dose every 8hrs as needed for pain.    Keep all follow-up visits as told by your health care provider. This is important. Contact a health care provider if:  You have redness,  swelling, or pain around your site.  You have fluid or blood coming from your site.  Your site feels warm to the touch.  You have pus or a bad smell coming from your site.  You have a fever.  Your sutures, skin glue, or adhesive strips loosen or come off sooner than expected. Get help right away if:  You have bleeding that does not stop with pressure or a dressing. Summary  After the procedure, it is common to have some soreness, bruising, and itching at the site.  Follow instructions from your health care provider about how to take care of your site.  Check your site every day for signs of infection.  Contact a health care provider if you have redness, swelling, or pain around your site, or your site feels warm to the touch.  Keep all follow-up visits as told by your health care provider. This is important. This information is not intended to replace advice given to you by your health care provider. Make sure you discuss any questions you have with your health care provider. Document Released: 09/07/2015 Document Revised: 02/08/2018 Document Reviewed: 02/08/2018 Elsevier Interactive Patient Education  2019 [place]   1) The drugs that you were given will stay in your system until tomorrow so for the next 24 hours you should not:  A) Drive an automobile B) Make any legal decisions C) Drink any alcoholic beverage   2) You  may resume regular meals tomorrow.  Today it is better to start with liquids and gradually work up to solid foods.  You may eat anything you prefer, but it is better to start with liquids, then soup and crackers, and gradually work up to solid foods.   3) Please notify your doctor immediately if you have any unusual bleeding, trouble breathing, redness and pain at the surgery site, drainage, fever, or pain not relieved by medication.   Please contact your physician with any problems or Same Day Surgery at  [phone], Monday through Friday 6 am to 4 pm, or [place] at [facility] number at [phone].

## 2021-01-02 NOTE — Op Note (Signed)
Preoperative diagnosis: Left breast carcinoma.  Postoperative diagnosis: Same.   Procedure: RF tag-localized left breast partial mastectomy.                      left axillary Sentinel Lymph node biopsy  Anesthesia: GETA  Surgeon: Dr. Benjamine Sprague  Wound Classification: Clean  Indications: Patient is a 65 y.o. female with a nonpalpable left breast mass noted on mammography with core biopsy demonstrating lobular carcinoma.  Subsequent MRI noted additional areas totaling 4 discerning lesions.  3 out of the 4 noted carcinoma and 1 showed discordant imaging findings.  Decision made to proceed with resection of all areas biopsied requires RF localizer placement, partial mastectomy for treatment with sentinel lymph node biopsy.   Specimen: Left breast mass x3, Sentinel Lymph nodes x 1  Complications: None  Estimated Blood Loss: 30 mL  Findings: 1. Specimen mammography shows marker and RF localizer on specimens, with confirmation of all 4 localizers and biopsy clips removed 2. Pathology call refers gross examination of margins was negative 3. No other palpable mass or lymph node identified.   Operation performed with curative intent:Yes  Tracer(s) used to identify sentinel nodes in the upfront surgery (non-neoadjuvant) setting (select all that apply):Radioactive Tracer  Tracer(s) used to identify sentinel nodes in the neoadjuvant setting (select all that apply):N/A  All nodes (colored or non-colored) present at the end of a dye-filled lymphatic channel were removed:Yes   All significantly radioactive nodes were removed:Yes  All palpable suspicious nodes were removed:N/A  Biopsy-proven positive nodes marked with clips prior to chemotherapy were identified and removed:N/A    Description of procedure: RF localization was performed by radiology prior to procedure. In the nuclear medicine suite, the subareolar region was injected with Tc-99 sulfur colloid the morning of procedure.  Localization studies were reviewed. The patient was taken to the operating room and placed supine on the operating table, and after general anesthesia the left breast and axilla were prepped and draped in the usual sterile fashion. A time-out was completed verifying correct patient, procedure, site, positioning, and implant(s) and/or special equipment prior to beginning this procedure.  By identifying the RF localizers, the probable trajectory and location of the mass was visualized.  This was a joint case with plastics performing a bilateral breast mastopexy.  Planned incision sites were previously marked by the plastic surgeon and 1 of these incisions was used to approach the RF tag locations.  The skin incision was made after infusion of local. Flaps were raised and  Sharp and blunt dissection was then taken down to the mass, taking care to include the entire RF localizer and a margin of grossly normal tissue.  The first specimen was removed. The specimen was oriented with paint and sent to radiology with the localization studies. Confirmation was received that the specimen contained 2 RF tags and 2 clips.  Additional exploration of the area found 2 additional spots with RF tag signals.  These areas were removed in an identical fashion as the first specimen. The specimens were oriented with paint and sent to radiology with the localization studies.  1 specimen did not have a associated biopsy clip with the RF tag, but the second specimen noted 2 biopsy clips and 1 RF tag.  Due to the diagnosis of lobular carcinoma, pathology was unable to clearly identify a masslike lesion within all the specimens, but initial inspection of the margins for each specimen did not yield any abnormal tissue.  A hand-held  gamma probe was used to identify the location of the hottest spot in the axilla. An incision was made around the caudal axillary hairline. Sharp and blunt Dissection was carried down to subdermal facias.  The probe was placed within wound and again, the point of maximal count was found. Dissection continue until nodule was identified. The probe was placed in contact with the node and 7350 counts were recorded. The node was excised in its entirety. Ex vivo, the node measured 75 counts when placed on the probe. The bed of the node measured 100 counts.  No additional hot spots detected. no clinically abnormal nodes were palpated after excision of initial sentinel lymph node. Both wounds irrigated, hemostasis was achieved and the axillary wound wound closed in layers with  interrupted sutures of 3-0 Vicryl in deep dermal layer and a running subcuticular suture of Monocryl 4-0, then dressed with dermabond.  The breast wound was closed by plastics.  The patient tolerated the procedure well and was taken to the postanesthesia care unit in stable condition. Sponge and instrument count correct at end of procedure.

## 2021-01-02 NOTE — Op Note (Signed)
Breast Reduction Op note:    DATE OF PROCEDURE: 01/02/2021  LOCATION: [facility]  SURGEON: Breezie Micucci Sanger Aquarius Tremper, DO  PREOPERATIVE DIAGNOSIS:  Left Breast cancer with postoperative asymmetry  POSTOPERATIVE DIAGNOSIS Same as preoperative diagnosis  PROCEDURES 1. Bilateral breast mastopexy for stymmetry  COMPLICATIONS: None.  DRAINS: none  INDICATIONS FOR PROCEDURE Holly Henderson is a 65 y.o. year-old female born on 03-08-1956,with a breast cancer on the left.  She will have a large partial mastectomy and be left with asymmetry.  MRN: [id number]  CONSENT Informed consent was obtained directly from the patient. The risks, benefits and alternatives were fully discussed. Specific risks including but not limited to bleeding, infection, hematoma, seroma, scarring, pain, nipple necrosis, asymmetry, poor cosmetic results, and need for further surgery were discussed. The patient had ample opportunity to have her questions answered to her satisfaction.  DESCRIPTION OF PROCEDURE  Patient was brought into the operating room and placed in a supine position.  SCDs were placed and appropriate padding was performed.  Antibiotics were given. The patient underwent general anesthesia and the chest was prepped and draped in a sterile fashion.  A timeout was performed and all information was confirmed to be correct.  Right side: Preoperative markings were confirmed.  Incision lines were injected with local with epinephrine.  After waiting for vasoconstriction, the marked lines were incised.  A Wise-pattern mastopexy was done by de-epithelializing the pedicle, using bovie to create the superomedial and central pedicle, and removing breast tissue from the inferior portion of the breast.  Care was taken to not undermine the breast pedicle. Hemostasis was achieved.  The nipple was gently lifted into position and the soft tissue closed with 3-0 and 4-0 Monocryl.   The pocket was irrigated and  hemostasis confirmed.  The deep tissues were approximated with 3-0 Monocryl sutures at the vertical limb and the skin was closed with deep dermal and subcuticular 4-0 Monocryl sutures.  The nipple and skin flaps had good capillary refill at the end of the procedure.    Left side: Preoperative markings were confirmed.  Incision lines were injected with local with epinephrine.  After waiting for vasoconstriction, the marked lines were incised at the superior lateral aspect. The partial mastectomy was done by general surgery.  When they were finished the patient was rendered to the plastic surgery service.  A Wise-pattern mastopexy was done by de-epithelializing the pedicle, using bovie to create the superomedial and central pedicle, and removing breast tissue from the inferior portion of the breast.  Care was taken to not undermine the breast pedicle. Hemostasis was achieved.  The nipple was gently lifted into position and the soft tissue closed with 3-0 and 4-0 Monocryl.   The pocket was irrigated and hemostasis confirmed.  The deep tissues were approximated with 3-0 Monocryl sutures at the vertical limb and the skin was closed with deep dermal and subcuticular 4-0 Monocryl sutures.  The nipple and skin flaps had good capillary refill at the end of the procedure.  The patient was sat upright and size and shape symmetry was confirmed.  Hemostasis was confirmed. Dermabond was applied.  A breast binder and ABDs were placed.  The nipple and skin flaps had good capillary refill at the end of the procedure.  The patient tolerated the procedure well. The patient was allowed to wake from anesthesia and taken to the recovery room in satisfactory condition.

## 2021-01-02 NOTE — H&P (Signed)
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 female who was referred for evaluation of above. Change was noted on last screening mammogram. Routine breast self exam performed and has not noticed any changes. ROS as noted below.  Past Medical History: has a past medical history of Abnormal Pap smear, Back pain (04/21/2012), Diabetes mellitus without complication (CMS-HCC) (7/41/2878), GERD (gastroesophageal reflux disease), History of cancer (11/06/2020), Hypertension, Primary osteoarthritis of left knee (01/01/2016), Primary osteoarthritis of one knee, left (01/01/2016), and Sleep apnea.  Past Surgical History: has a past surgical history that includes Cholecystectomy (2003); wisdom teeth; Fracture surgery (1970); Endometrial ablation; Colonoscopy (06/25/2018); Extraction Teeth; and Left TKA using all-cemented Biomet Vanguard system with a 65 mm PCR femur, a 71 mm tibial tray with a 10 mm anterior stabilized E-poly insert, and a 34 x 8.5 mm all-poly 3-pegged domed patella (Left, 01/26/2020).  Family History: family history includes Alcohol abuse in her maternal aunt, maternal uncle, mother, and paternal grandfather; Alzheimer's disease in her paternal grandmother; Aneurysm in her maternal grandfather; Aortic aneurysm in her maternal grandfather; Bipolar disorder in her maternal aunt; Breast cancer in her mother; Colon cancer in her paternal grandfather; Coronary Artery Disease (Blocked arteries around heart) in her father and maternal grandfather; Glaucoma in her maternal grandmother; Gout in her father and maternal grandfather; High blood pressure (Hypertension) in her father and mother; Hyperlipidemia (Elevated cholesterol) in her father; Lung cancer in her maternal grandmother; Obesity in her paternal grandmother and sister; Stroke in her maternal grandmother and mother.  Social History: reports that she has never smoked. She has never used  smokeless tobacco. She reports previous alcohol use. She reports that she does not use drugs.  Current Medications: has a current medication list which includes the following prescription(s): glucose blood, blood glucose meter, ergocalciferol (vitamin d2), lancets, losartan, metformin, multivitamin, and spironolactone.  Allergies:  Allergies as of 11/13/2020 - Reviewed 11/13/2020  Allergen Reaction Noted  . Latex, natural rubber Other (See Comments) 09/27/2020   ROS:  A 15 point review of systems was performed and was negative except as noted in HPI  Objective:    BP (!) 155/80  Pulse 89  Ht 161.3 cm (5' 3.5")  Wt (!) 127.9 kg (282 lb)  BMI 49.17 kg/m   Constitutional : alert, appears stated age, cooperative and no distress  Lymphatics/Throat: no asymmetry, masses, or scars  Respiratory: clear to auscultation bilaterally  Cardiovascular: regular rate and rhythm  Gastrointestinal: soft, non-tender; bowel sounds normal; no masses, no organomegaly.  Musculoskeletal: Steady gait and movement  Skin: Cool and moist  Psychiatric: Normal affect, non-agitated, not confused  Breast: Chaperone present for exam. breasts appear normal, no suspicious masses, no skin or nipple changes or axillary nodes.   LABS:  Reason for Addendum #1: Breast Biomarker Results   Specimen Submitted:  A. Breast, left,upper outer quadrant; biopsy   Clinical History: Focal asymmetry with possible distortion in left  breast upper outer quadrant, carcinoma vs PASH vs stromal fibrosis.  Post-biopsy mammograms show X-shaped biopsy clip positioned 8 mm  posterior-medial to the  biopsy site.   DIAGNOSIS:  A. BREAST, LEFT, UPPER OUTER QUADRANT; STEREOTACTIC-GUIDED CORE BIOPSY:  - INVASIVE LOBULAR CARCINOMA, CLASSIC TYPE.   Size of invasive carcinoma: at least 6 mm based on this sample; invasive  carcinoma is in multiple core fragments  Histologic grade of invasive carcinoma: Grade 1  Glandular/tubular differentiation score: 3            Nuclear pleomorphism score: 1            Mitotic rate score: 1            Total score: 5  Ductal carcinoma in situ: Not identified  Lobular carcinoma in situ: Present  Lymphovascular invasion: Not identified   ER/PR/HER2: Immunohistochemistry will be performed on block A2, with  reflex to [place] for HER2 2+. The results will be reported in an addendum.   Comment:  The definitive grade will be assigned on the excisional specimen.   GROSS DESCRIPTION:  A. Labeled: Left breast stereo biopsy asymmetry upper outer quadrant  Received: in a formalin-filled [place] collection device  Specimen radiograph image(s) available for review  Time/Date in fixative: Collected at 8:56 AM on 11/05/2020 and placed in  formalin 8:59 AM on 11/05/2020  Cold ischemic time: Less than 5 minutes  Total fixation time: 8 hours  Core pieces: Multiple  Measurement: Aggregate, 7.2 x 1.5 x 0.4 cm  Description / comments: Received are cores and fragments of yellow  fibrofatty tissue. A distinct diagram indicating the location of  calcifications is not received on the container or with the requisition.  Inked:Green  Entirely submitted in cassette(s):   1 - sections A and B  2 - sections C and D  3 - sections E and F  4 - sections G and H  5 - sections I, J, and K   RB 11/05/2020   Final Diagnosis performed by Bryan Lemma, MD.  Electronically signed  11/06/2020 12:47:51PM  The electronic signature indicates that the named Attending Pathologist  has evaluated the specimen  Technical component performed at [facility], [address] [place],  [place] [address] Lab: [phone] Dir: Rush Farmer, MD, MMM  Professional component performed at [facility], [facility], [place], [place], [place] [address] Lab: [phone]  Dir: Dellia Nims. Rubinas, MD   ADDENDUM:  CASE SUMMARY: BREAST BIOMARKER TESTS  TEST(S)  PERFORMED:  Estrogen Receptor (ER) Status: POSITIVE     Percentage of cells with nuclear positivity: Greater than 90%     Average intensity of staining: Strong   Progesterone Receptor (PgR) Status: POSITIVE     Percentage of cells with nuclear positivity: Greater than 90%     Average intensity of staining: Strong   HER2 (by immunohistochemistry): NEGATIVE (Score 1+)   Cold Ischemia and Fixation Times: Meet requirements specified in latest  version of the ASCO/CAP guidelines  Testing Performed on Block Number(s): A2   METHODS  Fixative: Formalin  Estrogen Receptor: FDA cleared ([place]) Primary Antibody: SP1  Progesterone Receptor: FDA cleared ([place]) Primary Antibody: 1E2  HER2 (by IHC): FDA cleared ([place]) Primary Antibody: 4B5 ([facility])  Immunohistochemistry controls worked appropriately. Slides were prepared  by [facility], [place], [place], and interpreted by Dr. Dicie Beam.   This test was developed and its performance characteristics determined  by [facility]. It has not been cleared or approved by the [place] Food and Drug  Administration. The FDA does not require this test to go through  premarket FDA review. This test is used for clinical purposes. It should  not be regarded as investigational or for research. This laboratory is  certified under the Clinical Laboratory Improvement Amendments (CLIA) as  qualified to perform high complexity clinical laboratory testing.   Addendum #1 performed by Bryan Lemma, MD.  Electronically signed  11/12/2020 1:57:40PM  The electronic signature indicates that  the named Attending Pathologist  has evaluated the specimen  Technical component performed at [facility], [address] [place],  [place] [address] Lab: [phone] Dir: Rush Farmer, MD, MMM  Professional component performed at [facility], [facility], [place], [place], [place] [address] Lab: [phone]  Dir: Dellia Nims. Rubinas, MD    RADS: CLINICAL DATA: Patient returns today to evaluate a possible LEFT  breast asymmetry with distortion in the LEFT breast.   EXAM:  DIGITAL DIAGNOSTIC UNILATERAL LEFT MAMMOGRAM WITH TOMOSYNTHESIS AND  CAD   TECHNIQUE:  Left digital diagnostic mammography and breast tomosynthesis was  performed. The images were evaluated with computer-aided detection.   COMPARISON: Previous exams including recent screening mammogram  dated 10/11/2020 and LEFT breast ultrasound dated 05/14/2017.   ACR Breast Density Category b: There are scattered areas of  fibroglandular density.   FINDINGS:  On today's additional diagnostic views, including spot compression  with 3D tomosynthesis, there is a persistent asymmetry in the  upper-outer quadrant of the LEFT breast, with possible subtle  associated architectural distortion, best demonstrated on cc views.   IMPRESSION:  Focal asymmetry in the upper-outer quadrant of the LEFT breast, with  possible subtle associated architectural distortion. This is a  suspicious finding for which stereotactic biopsy with 3D  tomosynthesis guidance is recommended.   RECOMMENDATION:  Stereotactic biopsy, with 3D tomosynthesis guidance, for the focal  asymmetry in the upper-outer quadrant of the LEFT breast, with  possible subtle associated architectural distortion, best seen on CC  views.   Ordering physician will be contacted and stereotactic biopsy will be  scheduled at patient's convenience.   I have discussed the findings and recommendations with the patient.  If applicable, a reminder letter will be sent to the patient  regarding the next appointment.   BI-RADS CATEGORY 4: Suspicious.   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 Lobular carcinoma of left breast (CMS-HCC) [C50.912] Discussed the risk of surgery including recurrence, chronic pain, post-op infxn, poor/delayed wound healing, poor cosmesis,  seroma, hematoma formation, and possible re-operation to address said risks. The risks of general anesthetic, if used, includes MI, CVA, sudden death or even reaction to anesthetic medications also discussed.  Typical post-op recovery time and possbility of activity restrictions were also discussed. Alternatives include continued observation. Benefits include possible symptom relief, pathologic evaluation, and/or curative excision.   The patient verbalized understanding and all questions were answered to the patient's satisfaction.  Subsequent MRI showed additional areas of concern, showing four total areas amenable to resection due to confirmed carcinoma and one area with discordant imaging.   2. Patient has elected to proceed with surgical treatment, concurrent oncoplastic reduction surgery with plastics as well as SLNB.

## 2021-01-03 ENCOUNTER — Encounter: Payer: Self-pay | Admitting: Surgery

## 2021-01-07 NOTE — Anesthesia Postprocedure Evaluation (Signed)
Anesthesia Post Note  Patient: Holly Henderson  Procedure(s) Performed: PART MASTECTOMY,RADIO FREQUENCY LOCALIZER,AXILLARY SENTINEL NODE BIOPSY (Left Breast) BILATERAL ONCOPLASTIC BREAST REDUCTION (Bilateral )  Patient location during evaluation: PACU Anesthesia Type: General Level of consciousness: awake and alert and oriented Pain management: pain level controlled Vital Signs Assessment: post-procedure vital signs reviewed and stable Respiratory status: spontaneous breathing Cardiovascular status: blood pressure returned to baseline Anesthetic complications: no   No complications documented.   Last Vitals:  Vitals:   01/02/21 1643 01/02/21 1706  BP: (!) 175/94 (!) 186/84  Pulse: 66 62  Resp:    Temp:    SpO2: 99% 100%    Last Pain:  Vitals:   01/03/21 0928  TempSrc:   PainSc: 1                  Tannor Pyon

## 2021-01-08 ENCOUNTER — Other Ambulatory Visit: Payer: Self-pay | Admitting: Anatomic Pathology & Clinical Pathology

## 2021-01-09 ENCOUNTER — Other Ambulatory Visit: Payer: Self-pay

## 2021-01-09 ENCOUNTER — Ambulatory Visit
Admission: RE | Admit: 2021-01-09 | Discharge: 2021-01-09 | Disposition: A | Discharge status: Home / Self Care | Payer: BC Managed Care – PPO | Source: Ambulatory Visit | Attending: Oncology | Admitting: Oncology

## 2021-01-09 DIAGNOSIS — C50912 Malignant neoplasm of unspecified site of left female breast: Secondary | ICD-10-CM | POA: Insufficient documentation

## 2021-01-09 DIAGNOSIS — Z170 Estrogen receptor positive status [ER+]: Secondary | ICD-10-CM | POA: Diagnosis present

## 2021-01-09 LAB — SURGICAL PATHOLOGY

## 2021-01-11 ENCOUNTER — Ambulatory Visit (INDEPENDENT_AMBULATORY_CARE_PROVIDER_SITE_OTHER): Payer: BC Managed Care – PPO | Admitting: Plastic Surgery

## 2021-01-11 ENCOUNTER — Other Ambulatory Visit: Payer: Self-pay

## 2021-01-11 ENCOUNTER — Encounter: Payer: Self-pay | Admitting: Plastic Surgery

## 2021-01-11 ENCOUNTER — Telehealth: Payer: Self-pay | Admitting: Plastic Surgery

## 2021-01-11 DIAGNOSIS — Z170 Estrogen receptor positive status [ER+]: Secondary | ICD-10-CM

## 2021-01-11 DIAGNOSIS — C50812 Malignant neoplasm of overlapping sites of left female breast: Secondary | ICD-10-CM

## 2021-01-11 NOTE — Progress Notes (Signed)
The patient is a 65 year old female here for follow-up on her her breast surgery.  This was done in combination with Dr. Lysle Pearl who did a left breast partial mastectomy with axillary nodes.  She has a fair bit of bruising on the left breast and swelling as expected.  At the same time she had oncoplastic breast reduction bilaterally.  I am going to leave the dressings in place for now.  She should do slight massage of the breasts.  She can shower and go into a sports bra.  Would like to see her back next week and then 2 weeks after that.

## 2021-01-11 NOTE — Telephone Encounter (Signed)
Called and spoke with the patient regarding her message below.  Informed her per Dr. Marla Roe can wait until her next visit with Matthew,PA-C.  Patient verbalized understanding and agreed.//AB/CMA

## 2021-01-11 NOTE — Telephone Encounter (Signed)
As patient was checking out, she wanted to know if the honeycomb and steri strips need to stay on until her visit with [facility] or if they can come off sooner. Please call her to advise.

## 2021-01-14 ENCOUNTER — Telehealth: Payer: Self-pay

## 2021-01-14 NOTE — Telephone Encounter (Signed)
-----   Message from Earlie Server, MD sent at 01/14/2021  8:47 AM EDT ----- Regarding: RE: reminder list pt Please send oncotype dx on breast surgery specimen.  Follow up with me MD 2 weeks after oncotype.  ----- Message ----- From: Vanice Sarah, CMA Sent: 01/10/2021  12:28 PM EDT To: Evelina Dun, RN, Vanice Sarah, CMA, # Subject: reminder list pt                               Pt on reminder list.  Surgery on 01/02/21--MD to check if need Oncytype

## 2021-01-14 NOTE — Telephone Encounter (Signed)
Oncotype ordered ([id number]) submitted on specimen case [phone]

## 2021-01-16 ENCOUNTER — Inpatient Hospital Stay: Payer: BC Managed Care – PPO | Attending: Oncology | Admitting: Oncology

## 2021-01-16 ENCOUNTER — Encounter: Payer: Self-pay | Admitting: Oncology

## 2021-01-16 VITALS — BP 123/78 | HR 79 | Temp 97.0°F | Resp 16 | Wt 260.3 lb

## 2021-01-16 DIAGNOSIS — Z79899 Other long term (current) drug therapy: Secondary | ICD-10-CM | POA: Insufficient documentation

## 2021-01-16 DIAGNOSIS — I10 Essential (primary) hypertension: Secondary | ICD-10-CM | POA: Diagnosis not present

## 2021-01-16 DIAGNOSIS — Z9049 Acquired absence of other specified parts of digestive tract: Secondary | ICD-10-CM | POA: Diagnosis not present

## 2021-01-16 DIAGNOSIS — C50412 Malignant neoplasm of upper-outer quadrant of left female breast: Secondary | ICD-10-CM | POA: Diagnosis not present

## 2021-01-16 DIAGNOSIS — N6092 Unspecified benign mammary dysplasia of left breast: Secondary | ICD-10-CM | POA: Diagnosis not present

## 2021-01-16 DIAGNOSIS — Z170 Estrogen receptor positive status [ER+]: Secondary | ICD-10-CM | POA: Diagnosis not present

## 2021-01-16 DIAGNOSIS — M2550 Pain in unspecified joint: Secondary | ICD-10-CM | POA: Insufficient documentation

## 2021-01-16 DIAGNOSIS — Z803 Family history of malignant neoplasm of breast: Secondary | ICD-10-CM | POA: Insufficient documentation

## 2021-01-16 DIAGNOSIS — C50812 Malignant neoplasm of overlapping sites of left female breast: Secondary | ICD-10-CM | POA: Diagnosis not present

## 2021-01-16 NOTE — Progress Notes (Signed)
[facility]  [address], [address] [place], [place] [address] Phone: [phone]  Fax: [phone]   Clinic Day:  01/16/2021  Referring physician: Ulyess Blossom, PA  Chief Complaint: Holly Henderson is a 65 y.o. female presents for follow-up of left breast lobular carcinoma.  PERTINENT ONCOLOGY HISTORY Holly Henderson is a 65 y.o.afemale who has above oncology history reviewed by me today presented for follow up visit for management of left breast lobular carcinoma  Patient previously followed up by Dr.Corcoran, patient switched care to me on 12/22/20 Extensive medical records review was performed by me 10/11/2020 Bilateral screening mammogram on  revealed asymmetry with distortion in the left breast.   10/29/2020 Left diagnostic mammogram  revealed focal asymmetry in the upper-outer quadrant of the LEFT breast, with possible subtle associated architectural distortion.  11/05/2020, left upper outer quadrant breast mass biopsy showed grade 1 invasive lobular carcinoma, ER 90%, PR 90%, HER2 negative IHC  11/16/2020 bilateral breast MRI revealed suspicious findings in the left breast. Recommended 3 site biopsy in the left breast: 1) 0.5 cm mass medial upper left breast. 2) 1 cm area of focal enhancement posterior lateral lower left breast. 3) Most posterior aspect of enhancement in the previously biopsy tumor area to assess posterior extent of disease.  11/29/2020 patient underwent additional biopsy of the left breast Lateral mid left breast needle biopsy  revealed invasive lobular carcinoma with DCIS and lobular neoplasia- (atypical lobular hyperplasia) Lower outer left breast needle biopsy revealed a focus suspicious for perineural invasion and lobular neoplasia (atypical lobular hyperplasia).  Upper inner left breast biopsy revealed focal fat necrosis with associated inflammation. Tumor cells were ER + (85%), PR + (95%), HER2 - (1+). Ki67 was  2%.  Patient's case was discussed on breast tumor board 12/17/2020. Area of concern spans 11-12 centimeters in all sites will need to be resected.  Patient has established care with Dr. Lysle Pearl and Dr. Marla Roe. Patient has decided on bilateral oncoplastic breast reduction.   INTERVAL HISTORY Holly Henderson is a 65 y.o. female who has above history reviewed by me today presents for follow up visit for left breast lobular carcinoma Problems and complaints are listed below: 01/02/2021, patient underwent bilateral oncoplastic breast reduction Pathology showed DIAGNOSIS:  A. BREAST, LEFT; EXCISION: - BENIGN MAMMARY PARENCHYMA WITH FOCAL LOBULAR NEOPLASIA.  - CLIPS X2 (DUMBBELL AND CYLINDER), RF TAGS X2, AND BIOPSY SITES IDENTIFIED.  - NO EVIDENCE OF RESIDUAL INVASIVE CARCINOMA.   B. BREAST, LEFT LATERAL; EXCISION:  - INVASIVE LOBULAR CARCINOMA. - SINGLE RF TAG IDENTIFIED; NO BIOPSY CLIPS PRESENT. - BACKGROUND LOBULAR NEOPLASIA.   C. BREAST, LEFT INFERIOR/POSTERIOR; EXCISION:  - LOBULAR NEOPLASIA. - CLIPS X2 (DUMBBELL AND X), RF TAG X1, AND BIOPSY SITES IDENTIFIED. - NO EVIDENCE OF RESIDUAL INVASIVE CARCINOMA. - SEE CANCER SUMMARY.   D. LYMPH NODE, LEFT SENTINEL #1; EXCISION:  - TWO LYMPH NODES, NEGATIVE FOR MALIGNANCY (0/2).   E. BREAST, RIGHT; MASTOPEXY:  - BENIGN SKIN AND MAMMARY PARENCHYMA. - NEGATIVE FOR ATYPICAL PROLIFERATIVE BREAST DISEASE.   F. BREAST, LEFT; MASTOPEXY:  - BENIGN SKIN AND SUBCUTANEOUS TISSUE. - NEGATIVE FOR MALIGNANCY.   Today she reports feeling well.  No concerns about her surgical wound.  No fever or chills.  Review of Systems  Constitutional: Negative for chills and fever.  HENT:   Negative for sore throat.   Eyes: Negative for eye problems.  Respiratory: Negative for cough, shortness of breath and wheezing.   Cardiovascular: Negative for chest  pain and palpitations.  Gastrointestinal: Negative for abdominal pain, blood in stool, nausea and vomiting.   Endocrine: Negative for hot flashes.  Genitourinary: Negative for dysuria.   Musculoskeletal: Positive for arthralgias. Negative for myalgias.  Skin: Negative for rash.  Neurological: Negative for dizziness.  Hematological: Does not bruise/bleed easily.     Past Medical History:  Diagnosis Date  . Diabetes mellitus without complication ([place])   . Hypertension   . Malignant neoplasm of upper-outer quadrant of left female breast ([place]) 11/13/2020    Past Surgical History:  Procedure Laterality Date  . BREAST BIOPSY Left 11/05/2020   stereo bx, x-clip, path pending   . BREAST REDUCTION SURGERY Bilateral 01/02/2021   Procedure: BILATERAL ONCOPLASTIC BREAST REDUCTION;  Surgeon: Wallace Going, DO;  Location: [facility] ORS;  Service: Plastics;  Laterality: Bilateral;  . CHOLECYSTECTOMY    . COLONOSCOPY    . dental work    . FRACTURE SURGERY     left - right arm - left arm  . PART MASTECTOMY,RADIO FREQUENCY LOCALIZER,AXILLARY SENTINEL NODE BIOPSY Left 01/02/2021   Procedure: PART MASTECTOMY,RADIO FREQUENCY LOCALIZER,AXILLARY SENTINEL NODE BIOPSY;  Surgeon: Benjamine Sprague, DO;  Location: [facility] ORS;  Service: General;  Laterality: Left;  . TOTAL KNEE ARTHROPLASTY Left 01/26/2020   Procedure: TOTAL KNEE ARTHROPLASTY;  Surgeon: Corky Mull, MD;  Location: [facility] ORS;  Service: Orthopedics;  Laterality: Left;    Family History  Problem Relation Age of Onset  . Breast cancer Mother 53    Social History:  reports that she has never smoked. She has never used smokeless tobacco. She reports current alcohol use. She reports that she does not use drugs. She has never smoked. She has not had any alcohol since she was diagnosed with diabetes. She denies exposure to radiation or toxins. She has 6 children. She previously lived in [place]. 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 The patient is alone today.  Allergies:  Allergies  Allergen Reactions  . Latex Other (See Comments)    Irritation/gyn     Current Medications: Current Outpatient Medications  Medication Sig Dispense Refill  . Dulaglutide 0.75 MG/0.5ML SOPN Inject 0.75 mg into the skin every Monday.    . losartan (COZAAR) 50 MG tablet Take 50 mg by mouth daily.    . metFORMIN (GLUCOPHAGE) 1000 MG tablet Take 1,000 mg by mouth 2 (two) times daily with a meal.    . Multiple Vitamins-Minerals (MULTIVITAMIN WITH MINERALS) tablet Take 1 tablet by mouth daily.    Marland Kitchen spironolactone (ALDACTONE) 25 MG tablet Take 25 mg by mouth daily.    Marland Kitchen acetaminophen (TYLENOL) 325 MG tablet Take 2 tablets (650 mg total) by mouth every 8 (eight) hours as needed for mild pain. (Patient not taking: Reported on 01/16/2021) 40 tablet 0  . HYDROcodone-acetaminophen (NORCO) 5-325 MG tablet Take 1 tablet by mouth every 6 (six) hours as needed for up to 15 doses for moderate pain. (Patient not taking: Reported on 01/16/2021) 15 tablet 0  . ibuprofen (ADVIL) 800 MG tablet Take 1 tablet (800 mg total) by mouth every 8 (eight) hours as needed for mild pain or moderate pain. (Patient not taking: Reported on 01/16/2021) 30 tablet 0  . ondansetron (ZOFRAN) 4 MG tablet Take 1 tablet (4 mg total) by mouth every 8 (eight) hours as needed for nausea or vomiting. (Patient not taking: Reported on 01/16/2021) 20 tablet 0   No current facility-administered medications for this visit.     Performance status (ECOG): 1  Vitals Blood pressure 123/78, pulse 79, temperature (!) 97 F (36.1 C), resp. rate 16, weight 260 lb 4.8 oz (118.1 kg).   Physical Exam Vitals and nursing note reviewed.  Constitutional:      General: She is not in acute distress.    Appearance: She is not diaphoretic.  Eyes:     General: No scleral icterus.    Conjunctiva/sclera: Conjunctivae normal.  Cardiovascular:     Rate and Rhythm: Normal rate and regular rhythm.     Pulses: Normal pulses.  Pulmonary:     Effort: Pulmonary effort is normal.     Breath sounds: Normal breath sounds.   Abdominal:     General: There is no distension.  Musculoskeletal:        General: No swelling.     Cervical back: Normal range of motion.  Skin:    General: Skin is warm and dry.  Neurological:     General: No focal deficit present.     Mental Status: She is alert and oriented to person, place, and time.  Psychiatric:        Mood and Affect: Mood normal.    Breast examination, status post bilateral oncoplastic reduction.  Healing scar, no discharge.  Focal soft tissue swelling and tenderness.   Laboratory findings CBC    Component Value Date/Time   WBC 7.8 11/13/2020 1208   RBC 4.50 11/13/2020 1208   HGB 13.5 11/13/2020 1208   HCT 40.4 11/13/2020 1208   PLT 261 11/13/2020 1208   MCV 89.8 11/13/2020 1208   MCH 30.0 11/13/2020 1208   MCHC 33.4 11/13/2020 1208   RDW 13.5 11/13/2020 1208   LYMPHSABS 1.4 11/13/2020 1208   MONOABS 0.7 11/13/2020 1208   EOSABS 0.2 11/13/2020 1208   BASOSABS 0.0 11/13/2020 1208   CMP Latest Ref Rng & Units 11/13/2020 01/29/2020 01/28/2020  Glucose 70 - 99 mg/dL 120(H) 134(H) 155(H)  BUN 8 - 23 mg/dL _0 Creatinine 0.44 - 1.00 mg/dL 0.64 0.69 0.64  Sodium 135 - 145 mmol/L 138 135 136  Potassium 3.5 - 5.1 mmol/L 4.1 4.7 4.4  Chloride 98 - 111 mmol/L 107 101 104  CO2 22 - 32 mmol/L _1 Calcium 8.9 - 10.3 mg/dL 9.3 8.7(L) 8.8(L)  Total Protein 6.5 - 8.1 g/dL 7.3 - -  Total Bilirubin 0.3 - 1.2 mg/dL 0.9 - -  Alkaline Phos 38 - 126 U/L 66 - -  AST 15 - 41 U/L 24 - -  ALT 0 - 44 U/L 32 - -     Preoperation CA 27-29 is normal 28.6  Assessment:  Holly Henderson is a 65 y.o. female with left breast cancer s/p biopsy on 11/05/2020.  Pathology revealed a 6 mm, grade I, invasive lobular carcinoma, classic type.  ER/PR positive, HER2 negative 1. Malignant neoplasm of overlapping sites of left breast in female, estrogen receptor positive ([place])    Cancer Staging Malignant neoplasm of overlapping sites of left breast in female, estrogen  receptor positive ([place]) Staging form: Breast, AJCC 8th Edition - Clinical stage from 12/21/2020: Stage Unknown (cTX, cN0, cM0, G2, ER+, PR+, HER2-) - Signed by Earlie Server, MD on 12/22/2020     # Invasive lobular left breast cancer, ER/PR +, HER2 - pT1bN0 status post bilateral oncoplastic reduction. Pathology was reviewed on breast tumor board on 01/16/2021. Residual invasive lobular carcinoma is not identified associated with any of the 4 clips/biopsy sites, focal invasive lobular carcinoma is identified within specimen  B, left lateral breast, abuts the medial edge of this excision.  At this excision is immediately lateral to specimen 8 this does not represent a true surgical margin.  Or surgical margins appear widely negative for tumor.  Lobular neoplasm is identified throughout the specimens.  Oncotype DX recurrence score has been sent and is pending. Assuming that she does not need adjuvant chemotherapy, will coordinate patient to establish care with radiation oncology for adjuvant radiation. Discussed about adjuvant antiestrogen therapy rationale and potential side effects.   01/09/2021, DEXA showed normal bone density.  Recommend over-the-counter calcium and vitamin D limitation.  All questions were answered to patient's satisfaction.   RTC 2 weeks after radiation.  Earlie Server, MD, PhD Hematology Oncology [place] at [facility] 01/16/2021

## 2021-01-16 NOTE — Progress Notes (Signed)
Patient denies new problems/concerns today.   °

## 2021-01-18 ENCOUNTER — Other Ambulatory Visit: Payer: Self-pay

## 2021-01-18 ENCOUNTER — Ambulatory Visit (INDEPENDENT_AMBULATORY_CARE_PROVIDER_SITE_OTHER): Payer: BC Managed Care – PPO | Admitting: Surgical

## 2021-01-18 DIAGNOSIS — Z9889 Other specified postprocedural states: Secondary | ICD-10-CM

## 2021-01-18 DIAGNOSIS — C50812 Malignant neoplasm of overlapping sites of left female breast: Secondary | ICD-10-CM

## 2021-01-18 DIAGNOSIS — Z170 Estrogen receptor positive status [ER+]: Secondary | ICD-10-CM

## 2021-01-18 NOTE — Progress Notes (Signed)
Patient is a 65 year old female here for follow-up after immediate bilateral oncoplastic breast reduction with Dr. Marla Roe after left partial mastectomy and sentinel lymph node biopsies by Dr. Lysle Pearl with general surgery.  She is just over 2 weeks postop.  She reports overall she is doing well.  She reports that she notices some increased swelling at the end of the day.  She reports that she is continue to wear compressive garment 24/7.  She reports that she recently saw Dr. Lysle Pearl with general surgery and he has recommended some warm compresses to the left lateral breast for a hematoma.  She reports that she is scheduled to see radiation oncology in 2 weeks to discuss starting radiation.  She is unsure if she will need chemotherapy or not.  She is not having any infectious symptoms.  Chaperone present on exam On exam bilateral NAC's are viable, bilateral breast incisions are intact.  Left axilla/superior lateral breast incision is intact as well.  There is no erythema or cellulitic changes.  She does have a lot of bruising of the left breast, however this appears to be improving.  There is no areas of fluid collection noted with palpation.  She does have firmness of the left superior lateral breast which is likely some fat necrosis versus hematoma.  Recommend continue her compressive garment 24/7.  Recommend continue avoid strenuous activities. Recommend continue to avoid heavy lifting.  Recommend beginning the use of scar creams and 2 weeks if she would like.  Recommend following up in 4 weeks for reevaluation.  There is no sign of infection, seroma.

## 2021-01-24 ENCOUNTER — Encounter: Payer: Self-pay | Admitting: Oncology

## 2021-01-24 NOTE — Telephone Encounter (Signed)
Oncotype results scanned in chart. 

## 2021-01-28 ENCOUNTER — Telehealth: Payer: Self-pay

## 2021-01-28 NOTE — Telephone Encounter (Signed)
-----   Message from Earlie Server, MD sent at 01/26/2021  1:55 PM EDT ----- Let her know that no chemo needed. Refer to Radonc and reminder list in a few weeks to check RT schedule. Recommend MD visit with me 2 -3 weeks after last RT. Thanks.

## 2021-01-28 NOTE — Telephone Encounter (Signed)
Patient informed of MD recommendation and voiced understanding. Pt has been added to reminder list to schedule f/u appt.

## 2021-01-30 ENCOUNTER — Inpatient Hospital Stay: Payer: BC Managed Care – PPO | Attending: Oncology | Admitting: Licensed Clinical Social Worker

## 2021-01-30 ENCOUNTER — Encounter: Payer: Self-pay | Admitting: Radiation Oncology

## 2021-01-30 ENCOUNTER — Inpatient Hospital Stay: Payer: BC Managed Care – PPO

## 2021-01-30 ENCOUNTER — Encounter: Payer: Self-pay | Admitting: Licensed Clinical Social Worker

## 2021-01-30 ENCOUNTER — Ambulatory Visit
Admission: RE | Admit: 2021-01-30 | Discharge: 2021-01-30 | Disposition: A | Discharge status: Home / Self Care | Payer: BC Managed Care – PPO | Source: Ambulatory Visit | Attending: Radiation Oncology | Admitting: Radiation Oncology

## 2021-01-30 VITALS — BP 122/64 | HR 75 | Temp 95.3°F | Resp 16 | Wt 258.8 lb

## 2021-01-30 DIAGNOSIS — E119 Type 2 diabetes mellitus without complications: Secondary | ICD-10-CM | POA: Diagnosis not present

## 2021-01-30 DIAGNOSIS — Z803 Family history of malignant neoplasm of breast: Secondary | ICD-10-CM | POA: Insufficient documentation

## 2021-01-30 DIAGNOSIS — C50812 Malignant neoplasm of overlapping sites of left female breast: Secondary | ICD-10-CM | POA: Diagnosis present

## 2021-01-30 DIAGNOSIS — Z800 Family history of malignant neoplasm of digestive organs: Secondary | ICD-10-CM

## 2021-01-30 DIAGNOSIS — Z79899 Other long term (current) drug therapy: Secondary | ICD-10-CM | POA: Diagnosis not present

## 2021-01-30 DIAGNOSIS — Z808 Family history of malignant neoplasm of other organs or systems: Secondary | ICD-10-CM | POA: Diagnosis not present

## 2021-01-30 DIAGNOSIS — Z7984 Long term (current) use of oral hypoglycemic drugs: Secondary | ICD-10-CM | POA: Diagnosis not present

## 2021-01-30 DIAGNOSIS — Z170 Estrogen receptor positive status [ER+]: Secondary | ICD-10-CM | POA: Insufficient documentation

## 2021-01-30 DIAGNOSIS — I10 Essential (primary) hypertension: Secondary | ICD-10-CM | POA: Diagnosis not present

## 2021-01-30 NOTE — Progress Notes (Signed)
REFERRING PROVIDER: Earlie Server, MD [place],  [place] [address]  PRIMARY PROVIDER:  Ulyess Blossom, PA  PRIMARY REASON FOR VISIT:  1. Family history of breast cancer   2. Family history of colon cancer   3. Family history of glioblastoma      HISTORY OF PRESENT ILLNESS:   Holly Henderson, a 65 y.o. female, was seen for a [facility] cancer genetics consultation at the request of Dr. Tasia Catchings due to a personal and family history of cancer.  Holly Henderson presents to clinic today to discuss the possibility of a hereditary predisposition to cancer, genetic testing, and to further clarify her future cancer risks, as well as potential cancer risks for family members.   In 2022, at the age of 60, Holly Henderson was diagnosed with invasive lobular carcinoma of the left breast, ER/PR+, Her2-. The treatment plan includes surgery (completed 5/11), adjuvant radiation and adjuvant antiestrogens.     CANCER HISTORY:  Oncology History  Malignant neoplasm of overlapping sites of left breast in female, estrogen receptor positive ([place])  12/21/2020 Cancer Staging   Staging form: Breast, AJCC 8th Edition - Clinical stage from 12/21/2020: Stage Unknown (cTX, cN0, cM0, G2, ER+, PR+, HER2-) - Signed by Earlie Server, MD on 12/22/2020 Histologic grading system: 3 grade system   12/22/2020 Initial Diagnosis   Malignant neoplasm of overlapping sites of left breast in female, estrogen receptor positive ([place])      RISK FACTORS:  Menarche was at age 81.  First live birth at age 77.  OCP use for approximately 1 years.  Ovaries intact: yes.  Hysterectomy: no.  Menopausal status: postmenopausal.  HRT use: 0 years. Colonoscopy: yes; normal. Mammogram within the last year: yes. Number of breast biopsies: 1. Up to date with pelvic exams: yes.   Past Medical History:  Diagnosis Date  . Diabetes mellitus without complication ([place])   . Family history of breast cancer   . Family history of colon cancer   .  Family history of glioblastoma   . Hypertension   . Malignant neoplasm of upper-outer quadrant of left female breast ([place]) 11/13/2020    Past Surgical History:  Procedure Laterality Date  . BREAST BIOPSY Left 11/05/2020   stereo bx, x-clip, path pending   . BREAST REDUCTION SURGERY Bilateral 01/02/2021   Procedure: BILATERAL ONCOPLASTIC BREAST REDUCTION;  Surgeon: Wallace Going, DO;  Location: [facility] ORS;  Service: Plastics;  Laterality: Bilateral;  . CHOLECYSTECTOMY    . COLONOSCOPY    . dental work    . FRACTURE SURGERY     left - right arm - left arm  . PART MASTECTOMY,RADIO FREQUENCY LOCALIZER,AXILLARY SENTINEL NODE BIOPSY Left 01/02/2021   Procedure: PART MASTECTOMY,RADIO FREQUENCY LOCALIZER,AXILLARY SENTINEL NODE BIOPSY;  Surgeon: Benjamine Sprague, DO;  Location: [facility] ORS;  Service: General;  Laterality: Left;  . TOTAL KNEE ARTHROPLASTY Left 01/26/2020   Procedure: TOTAL KNEE ARTHROPLASTY;  Surgeon: Corky Mull, MD;  Location: [facility] ORS;  Service: Orthopedics;  Laterality: Left;    Social History   Socioeconomic History  . Marital status: Divorced    Spouse name: Not on file  . Number of children: 2  . Years of education: Not on file  . Highest education level: Not on file  Occupational History  . Not on file  Tobacco Use  . Smoking status: Never Smoker  . Smokeless tobacco: Never Used  Vaping Use  . Vaping Use: Never used  Substance and Sexual Activity  . Alcohol  use: Yes    Comment: occasional  . Drug use: Never  . Sexual activity: Not on file  Other Topics Concern  . Not on file  Social History Narrative   2 adult sons in the home   Social Determinants of Health   Financial Resource Strain: Not on file  Food Insecurity: Not on file  Transportation Needs: Not on file  Physical Activity: Not on file  Stress: Not on file  Social Connections: Not on file     FAMILY HISTORY:  We obtained a detailed, 4-generation family history.  Significant diagnoses are  listed below: Family History  Problem Relation Age of Onset  . Breast cancer Mother 32  . Cancer - Other Father        glioblastoma x2, d. 102  . Lung cancer Maternal Grandmother        hx smoking  . AAA (abdominal aortic aneurysm) Maternal Grandfather   . Colon cancer Paternal Grandfather        dx 71s, d. 75s  . Breast cancer Other    Holly Henderson has 4 sons, 2 daughters, no cancers. She has 1 brother, 1 sister, no cancers.   Holly Henderson mother had breast cancer at 12 and died of dementia/stroke at 71. Patient had 2 maternal uncles, 2 aunts, no cancers. Maternal grandmother had lung cancer and history of smoking, passed at 63. Grandfather died at 4 of an aortic aneurysm, he had a sister that died of breast cancer.   Holly Henderson's father had 2 glioblastomas and died at 4. Patient had 1 full paternal aunt, she is living in her 71s, no cancers. She also has 4 paternal half aunts/uncles, no cancers, limited information. Paternal grandmother died at 77, grandfather died in his 86s of colon cancer.   Holly Henderson is unaware of previous family history of genetic testing for hereditary cancer risks. Patient's maternal ancestors are of [place], [place], [place] and [place] descent, and paternal ancestors are of [place] descent. There is no reported Ashkenazi Jewish ancestry. There is no known consanguinity.    GENETIC COUNSELING ASSESSMENT: Holly Henderson is a 65 y.o. female with a personal and family history of breast cancer which is somewhat suggestive of a hereditary cancer syndrome and predisposition to cancer. We, therefore, discussed and recommended the following at today's visit.   DISCUSSION: We discussed that approximately 5-10% of breast cancer is hereditary  Most cases of hereditary breast cancer are associated with BRCA1/BRCA2 genes, although there are other genes associated with hereditary cancer as well including genes associated with colon cancer, glioblastomas, and other cancers .  We  discussed that testing is beneficial for several reasons including knowing about other cancer risks, identifying potential screening and risk-reduction options that may be appropriate, and to understand if other family members could be at risk for cancer and allow them to undergo genetic testing.   We reviewed the characteristics, features and inheritance patterns of hereditary cancer syndromes. We also discussed genetic testing, including the appropriate family members to test, the process of testing, insurance coverage and turn-around-time for results. We discussed the implications of a negative, positive and/or variant of uncertain significant result. We recommended Holly Henderson pursue genetic testing for the [facility] CancerNext-Expanded+RNA gene panel.   The CancerNext-Expanded + RNAinsight gene panel offered by [employer] and includes sequencing and rearrangement analysis for the following 77 genes: IP, ALK, APC*, ATM*, AXIN2, BAP1, BARD1, BLM, BMPR1A, BRCA1*, BRCA2*, BRIP1*, CDC73, CDH1*,CDK4, CDKN1B, CDKN2A, CHEK2*, CTNNA1, DICER1, FANCC, FH, FLCN, GALNT12,  KIF1B, LZTR1, MAX, MEN1, MET, MLH1*, MSH2*, MSH3, MSH6*, MUTYH*, NBN, NF1*, NF2, NTHL1, PALB2*, PHOX2B, PMS2*, POT1, PRKAR1A, PTCH1, PTEN*, RAD51C*, RAD51D*,RB1, RECQL, RET, SDHA, SDHAF2, SDHB, SDHC, SDHD, SMAD4, SMARCA4, SMARCB1, SMARCE1, STK11, SUFU, TMEM127, TP53*,TSC1, TSC2, VHL and XRCC2 (sequencing and deletion/duplication); EGFR, EGLN1, HOXB13, KIT, MITF, PDGFRA, POLD1 and POLE (sequencing only); EPCAM and GREM1 (deletion/duplication only).  Based on Holly Henderson's personal and family history of cancer, she meets medical criteria for genetic testing. Despite that she meets criteria, she may still have an out of pocket cost. We discussed that if her out of pocket cost for testing is over $100, the laboratory will call and confirm whether she wants to proceed with testing.  If the out of pocket cost of testing is less than $100 she will be billed  by the genetic testing laboratory.   PLAN: After considering the risks, benefits, and limitations, Holly Henderson provided informed consent to pursue genetic testing and the blood sample was sent to [facility] for analysis of the CancerNext-Expanded+RNA panel. Results should be available within approximately 2-3 weeks' time, at which point they will be disclosed by telephone to Holly Henderson, as will any additional recommendations warranted by these results. Holly Henderson will receive a summary of her genetic counseling visit and a copy of her results once available. This information will also be available in Epic.   Holly Henderson's questions were answered to her satisfaction today. Our contact information was provided should additional questions or concerns arise. Thank you for the referral and allowing [place] to share in the care of your patient.   Faith Rogue, MS, [facility] Genetic Counselor [email or web address] Phone: [phone]  The patient was seen for a total of 40 minutes in face-to-face genetic counseling.  Patient was seen alone.  Dr. Grayland Ormond was available for discussion regarding this case.   _______________________________________________________________________ For Office Staff:  Number of people involved in session: 1 Was an Intern/ student involved with case: no

## 2021-01-30 NOTE — Consult Note (Signed)
NEW PATIENT EVALUATION  Name: Holly Henderson  MRN: [id number]  Date:   01/30/2021     DOB: 16-Oct-1955   This 65 y.o. female patient presents to the clinic for initial evaluation of stage Ia (T1b N0 M0) invasive lobular carcinoma of the left breast status post wide local excision with mammoplasty.  REFERRING PHYSICIAN: Ulyess Blossom, PA  CHIEF COMPLAINT:  Chief Complaint  Patient presents with  . Breast Cancer    Initial consultation    DIAGNOSIS: The encounter diagnosis was Malignant neoplasm of overlapping sites of left breast in female, estrogen receptor positive ([place]).   PREVIOUS INVESTIGATIONS:  Mammogram ultrasound MRI scans reviewed Pathology report reviewed Clinical notes reviewed  HPI: Patient is a 65 year old female who presented with focal asymmetry in the upper outer quadrant of the left breast with some subtle possibly associated architectural distortion.She underwent an MRI scan showing 3 abnormalities in the left breast 1 in the medial upper left breast 1 in the posterior left lower breast and 1 in the most.  Aspect enhancement the previously biopsied tumor which was positive for invasive lobular carcinoma.  She underwent a wide local excision of all 3 areas as well as mastoplasty and mastoplasty of the right breast by plastic surgery.  There was an 8 mm focus of invasive lobular carcinoma overall grade 1 with margins clear at 10 mm.  Tumor was ER/PR positive HER2/neu not overexpressed.  She is done well postoperatively.  She had Oncotype DX showing a low risk for recurrence and will not receive systemic treatment.  She has some mild bilateral breast tenderness from her surgery although that is resolving.  She is seen today for radiation oncology opinion.  PLANNED TREATMENT REGIMEN: Hypofractionated left whole breast radiation  PAST MEDICAL HISTORY:  has a past medical history of Diabetes mellitus without complication ([place]), Family history of breast cancer,  Family history of colon cancer, Family history of glioblastoma, Hypertension, and Malignant neoplasm of upper-outer quadrant of left female breast ([place]) (11/13/2020).    PAST SURGICAL HISTORY:  Past Surgical History:  Procedure Laterality Date  . BREAST BIOPSY Left 11/05/2020   stereo bx, x-clip, path pending   . BREAST REDUCTION SURGERY Bilateral 01/02/2021   Procedure: BILATERAL ONCOPLASTIC BREAST REDUCTION;  Surgeon: Wallace Going, DO;  Location: [facility] ORS;  Service: Plastics;  Laterality: Bilateral;  . CHOLECYSTECTOMY    . COLONOSCOPY    . dental work    . FRACTURE SURGERY     left - right arm - left arm  . PART MASTECTOMY,RADIO FREQUENCY LOCALIZER,AXILLARY SENTINEL NODE BIOPSY Left 01/02/2021   Procedure: PART MASTECTOMY,RADIO FREQUENCY LOCALIZER,AXILLARY SENTINEL NODE BIOPSY;  Surgeon: Benjamine Sprague, DO;  Location: [facility] ORS;  Service: General;  Laterality: Left;  . TOTAL KNEE ARTHROPLASTY Left 01/26/2020   Procedure: TOTAL KNEE ARTHROPLASTY;  Surgeon: Corky Mull, MD;  Location: [facility] ORS;  Service: Orthopedics;  Laterality: Left;    FAMILY HISTORY: family history includes AAA (abdominal aortic aneurysm) in her maternal grandfather; Breast cancer in an other family member; Breast cancer (age of onset: 35) in her mother; Cancer - Other in her father; Colon cancer in her paternal grandfather; Lung cancer in her maternal grandmother.  SOCIAL HISTORY:  reports that she has never smoked. She has never used smokeless tobacco. She reports current alcohol use. She reports that she does not use drugs.  ALLERGIES: Latex  MEDICATIONS:  Current Outpatient Medications  Medication Sig Dispense Refill  . Dulaglutide 0.75 MG/0.5ML SOPN Inject 0.75 mg  into the skin every Monday.    . losartan (COZAAR) 50 MG tablet Take 50 mg by mouth daily.    . metFORMIN (GLUCOPHAGE) 1000 MG tablet Take 1,000 mg by mouth 2 (two) times daily with a meal.    . Multiple Vitamins-Minerals (MULTIVITAMIN WITH  MINERALS) tablet Take 1 tablet by mouth daily.    Marland Kitchen spironolactone (ALDACTONE) 25 MG tablet Take 25 mg by mouth daily.     No current facility-administered medications for this encounter.    ECOG PERFORMANCE STATUS:  0 - Asymptomatic  REVIEW OF SYSTEMS: Patient denies any weight loss, fatigue, weakness, fever, chills or night sweats. Patient denies any loss of vision, blurred vision. Patient denies any ringing  of the ears or hearing loss. No irregular heartbeat. Patient denies heart murmur or history of fainting. Patient denies any chest pain or pain radiating to her upper extremities. Patient denies any shortness of breath, difficulty breathing at night, cough or hemoptysis. Patient denies any swelling in the lower legs. Patient denies any nausea vomiting, vomiting of blood, or coffee ground material in the vomitus. Patient denies any stomach pain. Patient states has had normal bowel movements no significant constipation or diarrhea. Patient denies any dysuria, hematuria or significant nocturia. Patient denies any problems walking, swelling in the joints or loss of balance. Patient denies any skin changes, loss of hair or loss of weight. Patient denies any excessive worrying or anxiety or significant depression. Patient denies any problems with insomnia. Patient denies excessive thirst, polyuria, polydipsia. Patient denies any swollen glands, patient denies easy bruising or easy bleeding. Patient denies any recent infections, allergies or URI. Patient "s visual fields have not changed significantly in recent time.   PHYSICAL EXAM: BP 122/64 (BP Location: Left Arm, Patient Position: Sitting)   Pulse 75   Temp (!) 95.3 F (35.2 C)   Resp 16   Wt 258 lb 12.8 oz (117.4 kg)   BMI 44.42 kg/m  Patient is status post bilateral mastoplasty with incisions around the nipple areolar complex bilaterally.  No dominant masses noted in either breast.  No axillary or supraclavicular adenopathy is noted.  She  appears to have a seroma in the left axilla secondary to her sentinel node biopsy.  Well-developed well-nourished patient in NAD. HEENT reveals PERLA, EOMI, discs not visualized.  Oral cavity is clear. No oral mucosal lesions are identified. Neck is clear without evidence of cervical or supraclavicular adenopathy. Lungs are clear to A&P. Cardiac examination is essentially unremarkable with regular rate and rhythm without murmur rub or thrill. Abdomen is benign with no organomegaly or masses noted. Motor sensory and DTR levels are equal and symmetric in the upper and lower extremities. Cranial nerves II through XII are grossly intact. Proprioception is intact. No peripheral adenopathy or edema is identified. No motor or sensory levels are noted. Crude visual fields are within normal range.  LABORATORY DATA: Pathology reports reviewed    RADIOLOGY RESULTS: Mammograms ultrasound and MRI scans reviewed compatible with above-stated findings   IMPRESSION: Stage Ia invasive lobular carcinoma ER/PR positive the left breast status post wide local excision and mastoplasty in 65 year old female  PLAN: At this time I would recommend whole breast radiation to her left breast bleed.  Even though her breast is large would favor hypofractionated course of treatment over approximately 3 weeks.  Based on the mastoplasty do not think scar boost would be indicated since would be very hard to target the area of original tumor involvement.  Risks and benefits  of treatment including skin reaction fatigue alteration of blood counts possible inclusion of superficial lung all were reviewed with the patient in detail.  She comprehends my recommendations well.  I personally set up and ordered CT simulation for next week.  Patient also will be a candidate for antiestrogen therapy after completion of radiation.  I would like to take this opportunity to thank you for allowing me to participate in the care of your patient.Noreene Filbert, MD

## 2021-02-06 ENCOUNTER — Ambulatory Visit
Admission: RE | Admit: 2021-02-06 | Discharge: 2021-02-06 | Disposition: A | Discharge status: Home / Self Care | Payer: BC Managed Care – PPO | Source: Ambulatory Visit | Attending: Radiation Oncology | Admitting: Radiation Oncology

## 2021-02-06 DIAGNOSIS — Z510 Encounter for antineoplastic radiation therapy: Secondary | ICD-10-CM | POA: Diagnosis present

## 2021-02-06 DIAGNOSIS — C50812 Malignant neoplasm of overlapping sites of left female breast: Secondary | ICD-10-CM | POA: Insufficient documentation

## 2021-02-11 ENCOUNTER — Ambulatory Visit: Payer: Self-pay | Admitting: Licensed Clinical Social Worker

## 2021-02-11 ENCOUNTER — Encounter: Payer: Self-pay | Admitting: Licensed Clinical Social Worker

## 2021-02-11 ENCOUNTER — Telehealth: Payer: Self-pay

## 2021-02-11 ENCOUNTER — Telehealth: Payer: Self-pay | Admitting: Licensed Clinical Social Worker

## 2021-02-11 DIAGNOSIS — Z1379 Encounter for other screening for genetic and chromosomal anomalies: Secondary | ICD-10-CM

## 2021-02-11 DIAGNOSIS — Z808 Family history of malignant neoplasm of other organs or systems: Secondary | ICD-10-CM

## 2021-02-11 DIAGNOSIS — Z803 Family history of malignant neoplasm of breast: Secondary | ICD-10-CM

## 2021-02-11 DIAGNOSIS — C50812 Malignant neoplasm of overlapping sites of left female breast: Secondary | ICD-10-CM

## 2021-02-11 DIAGNOSIS — Z800 Family history of malignant neoplasm of digestive organs: Secondary | ICD-10-CM

## 2021-02-11 NOTE — Telephone Encounter (Signed)
Revealed negative genetic testing.  This normal result is reassuring and indicates that it is unlikely Holly Henderson's cancer is due to a hereditary cause.  It is unlikely that there is an increased risk of another cancer due to a mutation in one of these genes.  However, genetic testing is not perfect, and cannot definitively rule out a hereditary cause.  It will be important for her to keep in contact with genetics to learn if any additional testing may be needed in the future.

## 2021-02-11 NOTE — Telephone Encounter (Signed)
Patient called to say that Thursday night she had a little bit of a fever and her right breast was sore.  She said that her fever was gone and she felt better on Friday morning.  Patient said that she noticed a yellowish/brownish liquid discharge under her right breast at the incision area.  She said that she kept gauze on it all weekend and it doesn't leak much unless she picks up her breast.  Patient said that it looks like the incision has opened up and the opening is about the size of her pinky finger.  Please call.

## 2021-02-11 NOTE — Progress Notes (Signed)
HPI:  Holly Henderson was previously seen in the [place] clinic due to a personal and family history of cancer and concerns regarding a hereditary predisposition to cancer. Please refer to our prior cancer genetics clinic note for more information regarding our discussion, assessment and recommendations, at the time. Holly Henderson's recent genetic test results were disclosed to her, as were recommendations warranted by these results. These results and recommendations are discussed in more detail below.  CANCER HISTORY:  Oncology History  Malignant neoplasm of overlapping sites of left breast in female, estrogen receptor positive ([place])  12/21/2020 Cancer Staging   Staging form: Breast, AJCC 8th Edition - Clinical stage from 12/21/2020: Stage Unknown (cTX, cN0, cM0, G2, ER+, PR+, HER2-) - Signed by Earlie Server, MD on 12/22/2020  Histologic grading system: 3 grade system    12/22/2020 Initial Diagnosis   Malignant neoplasm of overlapping sites of left breast in female, estrogen receptor positive ([place])     Genetic Testing   Negative genetic testing. No pathogenic variants identified on the CancerNext-Expanded+RNA panel. The report date is 02/10/2021.  The CancerNext-Expanded + RNAinsight gene panel offered by [employer] and includes sequencing and rearrangement analysis for the following 77 genes: IP, ALK, APC*, ATM*, AXIN2, BAP1, BARD1, BLM, BMPR1A, BRCA1*, BRCA2*, BRIP1*, CDC73, CDH1*,CDK4, CDKN1B, CDKN2A, CHEK2*, CTNNA1, DICER1, FANCC, FH, FLCN, GALNT12, KIF1B, LZTR1, MAX, MEN1, MET, MLH1*, MSH2*, MSH3, MSH6*, MUTYH*, NBN, NF1*, NF2, NTHL1, PALB2*, PHOX2B, PMS2*, POT1, PRKAR1A, PTCH1, PTEN*, RAD51C*, RAD51D*,RB1, RECQL, RET, SDHA, SDHAF2, SDHB, SDHC, SDHD, SMAD4, SMARCA4, SMARCB1, SMARCE1, STK11, SUFU, TMEM127, TP53*,TSC1, TSC2, VHL and XRCC2 (sequencing and deletion/duplication); EGFR, EGLN1, HOXB13, KIT, MITF, PDGFRA, POLD1 and POLE (sequencing only); EPCAM and GREM1  (deletion/duplication only).     FAMILY HISTORY:  We obtained a detailed, 4-generation family history.  Significant diagnoses are listed below: Family History  Problem Relation Age of Onset   Breast cancer Mother 19   Cancer - Other Father        glioblastoma x2, d. 63   Lung cancer Maternal Grandmother        hx smoking   AAA (abdominal aortic aneurysm) Maternal Grandfather    Colon cancer Paternal Grandfather        dx 14s, d. 20s   Breast cancer Other    Holly Henderson has 4 sons, 2 daughters, no cancers. She has 1 brother, 1 sister, no cancers.   Holly Henderson mother had breast cancer at 36 and died of dementia/stroke at 77. Patient had 2 maternal uncles, 2 aunts, no cancers. Maternal grandmother had lung cancer and history of smoking, passed at 31. Grandfather died at 5 of an aortic aneurysm, he had a sister that died of breast cancer.   Holly Henderson's father had 2 glioblastomas and died at 61. Patient had 1 full paternal aunt, she is living in her 60s, no cancers. She also has 4 paternal half aunts/uncles, no cancers, limited information. Paternal grandmother died at 83, grandfather died in his 46s of colon cancer.    Holly Henderson is unaware of previous family history of genetic testing for hereditary cancer risks. Patient's maternal ancestors are of [place], [place], [place] and [place] descent, and paternal ancestors are of [place] descent. There is no reported Ashkenazi Jewish ancestry. There is no known consanguinity.      GENETIC TEST RESULTS: Genetic testing reported out on 02/10/2021 through the CancerNext-Expanded+RNA cancer panel found no pathogenic mutations.   The CancerNext-Expanded + RNAinsight gene panel offered by [employer] and includes  sequencing and rearrangement analysis for the following 77 genes: IP, ALK, APC*, ATM*, AXIN2, BAP1, BARD1, BLM, BMPR1A, BRCA1*, BRCA2*, BRIP1*, CDC73, CDH1*,CDK4, CDKN1B, CDKN2A, CHEK2*, CTNNA1, DICER1, FANCC, FH, FLCN, GALNT12,  KIF1B, LZTR1, MAX, MEN1, MET, MLH1*, MSH2*, MSH3, MSH6*, MUTYH*, NBN, NF1*, NF2, NTHL1, PALB2*, PHOX2B, PMS2*, POT1, PRKAR1A, PTCH1, PTEN*, RAD51C*, RAD51D*,RB1, RECQL, RET, SDHA, SDHAF2, SDHB, SDHC, SDHD, SMAD4, SMARCA4, SMARCB1, SMARCE1, STK11, SUFU, TMEM127, TP53*,TSC1, TSC2, VHL and XRCC2 (sequencing and deletion/duplication); EGFR, EGLN1, HOXB13, KIT, MITF, PDGFRA, POLD1 and POLE (sequencing only); EPCAM and GREM1 (deletion/duplication only).   The test report has been scanned into EPIC and is located under the Molecular Pathology section of the Results Review tab.  A portion of the result report is included below for reference.     We discussed that because current genetic testing is not perfect, it is possible there may be a gene mutation in one of these genes that current testing cannot detect, but that chance is small.  There could be another gene that has not yet been discovered, or that we have not yet tested, that is responsible for the cancer diagnoses in the family. It is also possible there is a hereditary cause for the cancer in the family that Holly Henderson did not inherit and therefore was not identified in her testing.  Therefore, it is important to remain in touch with cancer genetics in the future so that we can continue to offer Holly Henderson the most up to date genetic testing.   ADDITIONAL GENETIC TESTING: We discussed with Holly Henderson that her genetic testing was fairly extensive.  If there are genes identified to increase cancer risk that can be analyzed in the future, we would be happy to discuss and coordinate this testing at that time.    CANCER SCREENING RECOMMENDATIONS: Holly Henderson test result is considered negative (normal).  This means that we have not identified a hereditary cause for her  personal and family history of cancer at this time. Most cancers happen by chance and this negative test suggests that her cancer may fall into this category.    While reassuring, this  does not definitively rule out a hereditary predisposition to cancer. It is still possible that there could be genetic mutations that are undetectable by current technology. There could be genetic mutations in genes that have not been tested or identified to increase cancer risk.  Therefore, it is recommended she continue to follow the cancer management and screening guidelines provided by her oncology and primary healthcare provider.   An individual's cancer risk and medical management are not determined by genetic test results alone. Overall cancer risk assessment incorporates additional factors, including personal medical history, family history, and any available genetic information that may result in a personalized plan for cancer prevention and surveillance.  RECOMMENDATIONS FOR FAMILY MEMBERS:  Relatives in this family might be at some increased risk of developing cancer, over the general population risk, simply due to the family history of cancer.  We recommended female relatives in this family have a yearly mammogram beginning at age 71, or 56 years younger than the earliest onset of cancer, an annual clinical breast exam, and perform monthly breast self-exams. Female relatives in this family should also have a gynecological exam as recommended by their primary provider.  All family members should be referred for colonoscopy starting at age 30.   FOLLOW-UP: Lastly, we discussed with Holly Henderson that cancer genetics is a rapidly advancing field and it is possible that new  genetic tests will be appropriate for her and/or her family members in the future. We encouraged her to remain in contact with cancer genetics on an annual basis so we can update her personal and family histories and let her know of advances in cancer genetics that may benefit this family.   Our contact number was provided. Holly Henderson's questions were answered to her satisfaction, and she knows she is welcome to call us at anytime  with additional questions or concerns.   Faith Rogue, MS, [facility] Genetic Counselor [email or web address] Phone: [phone]

## 2021-02-11 NOTE — Telephone Encounter (Signed)
  -----   Message -----  From: Cindra Presume, MD  Sent: 02/11/2021   9:31 AM EDT  To: Cyndi Lennert   No.  She can keep her appointment with [facility] next week.  Would treat the wound with gauze dressings.  No other intervention needed.  ----- Message -----  From: Cyndi Lennert  Sent: 02/11/2021   9:27 AM EDT  To: Cindra Presume, MD   Hi Dr. Claudia Desanctis-   Holley Raring called to say that Thursday night she had a little bit of a fever and her right breast was sore.  She said that she had no fever and felt better on Friday morning but she noticed a yellowish/brownish liquid discharge under her right breast at the incision area.  She said that she kept gauze on it all weekend and it doesn't leak much unless she picks her breast up.  Aquilla said that it looks like her incision has opened up maybe the width of her pinky finger.   Would you like to see her this afternoon?   Thanks,  Maudie Mercury

## 2021-02-11 NOTE — Telephone Encounter (Signed)
Called and spoke with the patient and informed her of the message from Dr. Claudia Desanctis below.    She's to keep her appointment with [facility] next week, and treat the wound with gauze dressings.  No other intervention needed.    Patient asked if she could continue with taking a shower.  Informed the patient yes she can continue with taking a shower just make sure she tries to keep anything from getting in the opening area.  She can maybe cover up the area so nothing can get in it    Informed the patient if the area gets worse before her appointment please let us know.  Patient verbalized understanding and agreed.//AB/CMA

## 2021-02-11 NOTE — Telephone Encounter (Signed)
Called and spoke with the patient regarding the MyChart message.  Patient stated she no longer has a fever.  The drainage has lessen.  She stated the first day the drainage was pink and a little yellow.  Saturday morning the gauze was wet, but not soaked through.  A little brown tine last night.    Informed the patient that the provider (Dr. Claudia Desanctis) is in surgery this morning, but we have sent a message to him regarding her message.  When he get done with surgery we will contact her and let her know what he would like for her to do.    Informed the patient she can use vaseline and gauze on the open area.  Patient verbalized understanding and agreed.//AB/CMA

## 2021-02-11 NOTE — Telephone Encounter (Signed)
Called and spoke with the patient regarding the MyChart message.  Patient stated she no longer has a fever.  The drainage has lessen.  She stated the first day the drainage was pink and a little yellow.  Saturday morning the gauze was wet, but not soaked through.  A little brown tine last night.     Informed the patient that the provider (Dr. Claudia Desanctis) is in surgery this morning, but we have sent a message to him regarding her message.  When he get done with surgery we will contact her and let her know what he would like for her to do.     Informed the patient she can use vaseline and gauze on the open area.  Patient verbalized understanding and agreed.//AB/CMA

## 2021-02-14 DIAGNOSIS — C50812 Malignant neoplasm of overlapping sites of left female breast: Secondary | ICD-10-CM | POA: Diagnosis not present

## 2021-02-15 ENCOUNTER — Other Ambulatory Visit: Payer: Self-pay | Admitting: *Deleted

## 2021-02-15 DIAGNOSIS — Z170 Estrogen receptor positive status [ER+]: Secondary | ICD-10-CM

## 2021-02-15 DIAGNOSIS — C50812 Malignant neoplasm of overlapping sites of left female breast: Secondary | ICD-10-CM

## 2021-02-18 ENCOUNTER — Ambulatory Visit: Admission: RE | Admit: 2021-02-18 | Payer: BC Managed Care – PPO | Source: Ambulatory Visit

## 2021-02-18 DIAGNOSIS — C50812 Malignant neoplasm of overlapping sites of left female breast: Secondary | ICD-10-CM | POA: Diagnosis not present

## 2021-02-19 ENCOUNTER — Telehealth: Payer: Self-pay

## 2021-02-19 ENCOUNTER — Ambulatory Visit
Admission: RE | Admit: 2021-02-19 | Discharge: 2021-02-19 | Disposition: A | Discharge status: Home / Self Care | Payer: BC Managed Care – PPO | Source: Ambulatory Visit | Attending: Radiation Oncology | Admitting: Radiation Oncology

## 2021-02-19 ENCOUNTER — Ambulatory Visit (INDEPENDENT_AMBULATORY_CARE_PROVIDER_SITE_OTHER): Payer: BC Managed Care – PPO | Admitting: Surgical

## 2021-02-19 ENCOUNTER — Other Ambulatory Visit: Payer: Self-pay

## 2021-02-19 DIAGNOSIS — Z9889 Other specified postprocedural states: Secondary | ICD-10-CM

## 2021-02-19 DIAGNOSIS — C50812 Malignant neoplasm of overlapping sites of left female breast: Secondary | ICD-10-CM | POA: Diagnosis not present

## 2021-02-19 DIAGNOSIS — Z170 Estrogen receptor positive status [ER+]: Secondary | ICD-10-CM

## 2021-02-19 DIAGNOSIS — C50412 Malignant neoplasm of upper-outer quadrant of left female breast: Secondary | ICD-10-CM

## 2021-02-19 MED ORDER — DOXYCYCLINE HYCLATE 100 MG PO TABS: 100.0000 mg | ORAL_TABLET | Freq: Two times a day (BID) | ORAL | 0 refills | Status: AC

## 2021-02-19 NOTE — Progress Notes (Addendum)
Patient is a 65 year old female here for follow-up after immediate bilateral oncoplastic breast reduction with Dr. Marla Roe after left partial mastectomy and sentinel lymph node biopsies by Dr. Lysle Pearl on 01/02/2021.  She is 7 weeks postop.  Patient reports that she noticed increased pain of her right breast on 18 June.  She subsequently noticed drainage from the right breast.  She noticed that she has a wound and she has been applying Vaseline to this area.  She reports that she is doing much better after this but still has the wound and is concerned about this.  She is not having any fevers today.  She does have an elevated temperature in regards to her baseline but no measurable fevers.  She is scheduled to begin left breast radiation today. Chaperone present on exam Bilateral NAC's are viable.  Left breast incisions are intact and well-healed.  Right breast has a wound that is 2.5 x 1.3 x 1 cm.  There is a base of granulation tissue noted.  There is some fibrinous exudate noted within the wound bed.  No surrounding erythema or cellulitic changes noted.  It is minimally tender to palpation.  There is no foul odor noted.  Recommend Xeroform gauze, 4 x 4 gauze, ABD pad daily to right breast wound.  I discussed with the patient that this will likely take a few weeks for this to completely heal.  I discussed with her that I do not see any signs of infection, however given her symptoms we will send in a short course of antibiotics.  All of her questions were answered to her content.  A picture was taken of the right breast and placed in the patient's chart with patient's permission.  The right breast wound was mechanically debrided today in the office.  Patient tolerated this fine.  We will order her some supplies through [facility].  Recommend following up in 3 weeks for reevaluation.

## 2021-02-19 NOTE — Telephone Encounter (Addendum)
Faxed to [facility]: Change Daily.  4x4 guaze Xeroform Medipore Tape ABD Pads

## 2021-02-20 ENCOUNTER — Ambulatory Visit
Admission: RE | Admit: 2021-02-20 | Discharge: 2021-02-20 | Disposition: A | Discharge status: Home / Self Care | Payer: BC Managed Care – PPO | Source: Ambulatory Visit | Attending: Radiation Oncology | Admitting: Radiation Oncology

## 2021-02-20 DIAGNOSIS — C50812 Malignant neoplasm of overlapping sites of left female breast: Secondary | ICD-10-CM | POA: Diagnosis not present

## 2021-02-21 ENCOUNTER — Ambulatory Visit
Admission: RE | Admit: 2021-02-21 | Discharge: 2021-02-21 | Disposition: A | Discharge status: Home / Self Care | Payer: BC Managed Care – PPO | Source: Ambulatory Visit | Attending: Radiation Oncology | Admitting: Radiation Oncology

## 2021-02-21 DIAGNOSIS — C50812 Malignant neoplasm of overlapping sites of left female breast: Secondary | ICD-10-CM | POA: Diagnosis not present

## 2021-02-22 ENCOUNTER — Ambulatory Visit
Admission: RE | Admit: 2021-02-22 | Discharge: 2021-02-22 | Disposition: A | Discharge status: Home / Self Care | Payer: BC Managed Care – PPO | Source: Ambulatory Visit | Attending: Radiation Oncology | Admitting: Radiation Oncology

## 2021-02-22 DIAGNOSIS — Z510 Encounter for antineoplastic radiation therapy: Secondary | ICD-10-CM | POA: Diagnosis present

## 2021-02-22 DIAGNOSIS — C50812 Malignant neoplasm of overlapping sites of left female breast: Secondary | ICD-10-CM | POA: Insufficient documentation

## 2021-02-26 ENCOUNTER — Ambulatory Visit
Admission: RE | Admit: 2021-02-26 | Discharge: 2021-02-26 | Disposition: A | Discharge status: Home / Self Care | Payer: BC Managed Care – PPO | Source: Ambulatory Visit | Attending: Radiation Oncology | Admitting: Radiation Oncology

## 2021-02-26 DIAGNOSIS — C50812 Malignant neoplasm of overlapping sites of left female breast: Secondary | ICD-10-CM | POA: Diagnosis not present

## 2021-02-27 ENCOUNTER — Ambulatory Visit
Admission: RE | Admit: 2021-02-27 | Discharge: 2021-02-27 | Disposition: A | Discharge status: Home / Self Care | Payer: BC Managed Care – PPO | Source: Ambulatory Visit | Attending: Radiation Oncology | Admitting: Radiation Oncology

## 2021-02-27 DIAGNOSIS — C50812 Malignant neoplasm of overlapping sites of left female breast: Secondary | ICD-10-CM | POA: Diagnosis not present

## 2021-02-28 ENCOUNTER — Ambulatory Visit
Admission: RE | Admit: 2021-02-28 | Discharge: 2021-02-28 | Disposition: A | Discharge status: Home / Self Care | Payer: BC Managed Care – PPO | Source: Ambulatory Visit | Attending: Radiation Oncology | Admitting: Radiation Oncology

## 2021-02-28 DIAGNOSIS — C50812 Malignant neoplasm of overlapping sites of left female breast: Secondary | ICD-10-CM | POA: Diagnosis not present

## 2021-03-01 ENCOUNTER — Ambulatory Visit
Admission: RE | Admit: 2021-03-01 | Discharge: 2021-03-01 | Disposition: A | Discharge status: Home / Self Care | Payer: BC Managed Care – PPO | Source: Ambulatory Visit | Attending: Radiation Oncology | Admitting: Radiation Oncology

## 2021-03-01 ENCOUNTER — Other Ambulatory Visit: Payer: Self-pay

## 2021-03-01 ENCOUNTER — Inpatient Hospital Stay: Payer: BC Managed Care – PPO | Attending: Oncology

## 2021-03-01 DIAGNOSIS — C50812 Malignant neoplasm of overlapping sites of left female breast: Secondary | ICD-10-CM

## 2021-03-01 DIAGNOSIS — Z170 Estrogen receptor positive status [ER+]: Secondary | ICD-10-CM | POA: Diagnosis present

## 2021-03-01 LAB — CBC
HCT: 41.2 % (ref 36.0–46.0)
Hemoglobin: 13.6 g/dL (ref 12.0–15.0)
MCH: 31.1 pg (ref 26.0–34.0)
MCHC: 33 g/dL (ref 30.0–36.0)
MCV: 94.1 fL (ref 80.0–100.0)
Platelets: 225 10*3/uL (ref 150–400)
RBC: 4.38 MIL/uL (ref 3.87–5.11)
RDW: 13.3 % (ref 11.5–15.5)
WBC: 7.6 10*3/uL (ref 4.0–10.5)
nRBC: 0 % (ref 0.0–0.2)

## 2021-03-04 ENCOUNTER — Telehealth: Payer: Self-pay

## 2021-03-04 ENCOUNTER — Ambulatory Visit
Admission: RE | Admit: 2021-03-04 | Discharge: 2021-03-04 | Disposition: A | Discharge status: Home / Self Care | Payer: BC Managed Care – PPO | Source: Ambulatory Visit | Attending: Radiation Oncology | Admitting: Radiation Oncology

## 2021-03-04 DIAGNOSIS — C50812 Malignant neoplasm of overlapping sites of left female breast: Secondary | ICD-10-CM | POA: Diagnosis not present

## 2021-03-04 NOTE — Telephone Encounter (Signed)
Pt scheduled for final XRT on 7/20. Please schedule MD follow up 2-3 weeks after final XRT and notify pt of appt. thanks

## 2021-03-05 ENCOUNTER — Ambulatory Visit
Admission: RE | Admit: 2021-03-05 | Discharge: 2021-03-05 | Disposition: A | Discharge status: Home / Self Care | Payer: BC Managed Care – PPO | Source: Ambulatory Visit | Attending: Radiation Oncology | Admitting: Radiation Oncology

## 2021-03-05 DIAGNOSIS — C50812 Malignant neoplasm of overlapping sites of left female breast: Secondary | ICD-10-CM | POA: Diagnosis not present

## 2021-03-06 ENCOUNTER — Ambulatory Visit
Admission: RE | Admit: 2021-03-06 | Discharge: 2021-03-06 | Disposition: A | Discharge status: Home / Self Care | Payer: BC Managed Care – PPO | Source: Ambulatory Visit | Attending: Radiation Oncology | Admitting: Radiation Oncology

## 2021-03-06 DIAGNOSIS — C50812 Malignant neoplasm of overlapping sites of left female breast: Secondary | ICD-10-CM | POA: Diagnosis not present

## 2021-03-07 ENCOUNTER — Ambulatory Visit
Admission: RE | Admit: 2021-03-07 | Discharge: 2021-03-07 | Disposition: A | Discharge status: Home / Self Care | Payer: BC Managed Care – PPO | Source: Ambulatory Visit | Attending: Radiation Oncology | Admitting: Radiation Oncology

## 2021-03-07 DIAGNOSIS — C50812 Malignant neoplasm of overlapping sites of left female breast: Secondary | ICD-10-CM | POA: Diagnosis not present

## 2021-03-08 ENCOUNTER — Ambulatory Visit
Admission: RE | Admit: 2021-03-08 | Discharge: 2021-03-08 | Disposition: A | Discharge status: Home / Self Care | Payer: BC Managed Care – PPO | Source: Ambulatory Visit | Attending: Radiation Oncology | Admitting: Radiation Oncology

## 2021-03-08 ENCOUNTER — Encounter: Payer: Self-pay | Admitting: Surgical

## 2021-03-08 ENCOUNTER — Other Ambulatory Visit: Payer: Self-pay

## 2021-03-08 ENCOUNTER — Ambulatory Visit (INDEPENDENT_AMBULATORY_CARE_PROVIDER_SITE_OTHER): Payer: BC Managed Care – PPO | Admitting: Surgical

## 2021-03-08 DIAGNOSIS — Z170 Estrogen receptor positive status [ER+]: Secondary | ICD-10-CM

## 2021-03-08 DIAGNOSIS — C50812 Malignant neoplasm of overlapping sites of left female breast: Secondary | ICD-10-CM

## 2021-03-08 DIAGNOSIS — Z9889 Other specified postprocedural states: Secondary | ICD-10-CM

## 2021-03-08 DIAGNOSIS — C50412 Malignant neoplasm of upper-outer quadrant of left female breast: Secondary | ICD-10-CM

## 2021-03-08 NOTE — Progress Notes (Signed)
Patient is a 65 year old female here for follow-up after immediate bilateral oncoplastic breast reduction with Dr. Marla Roe after left partial mastectomy and sentinel lymph node biopsies by Dr. Lysle Pearl on 01/02/2021.  She is 9 weeks postop.  At her last appointment patient had wound of right breast.  She has started left breast radiation and reports that this is overall going okay, she reports she has a few more treatments and she will be finished.  She reports that the right breast wound has started to improve over the last few days and she is very pleased with this.  She is not having any infectious symptoms.  Chaperone present on exam On exam bilateral NAC's are viable, right breast wound is approximately 1 x 0.3 cm with no surrounding erythema or cellulitic changes noted.  The rest of the bilateral breast incisions are intact and healing well.  Left axillary incision is intact and healing well.  No subcutaneous fluid collection noted with palpation.  No foul odor is noted.  No purulence or fluctuance noted with palpation of right breast wound.  Recommend continuing with Xeroform gauze to right breast wound daily.  Recommend avoiding any strenuous activities or heavy lifting until the right breast wound has healed.  Recommend continue her compressive garments.  Recommend following up in 3 weeks for reevaluation.  I discussed with the patient that if in 3 weeks her wound has healed and she would like to change to a telephone visit we can certainly do that.  I am comfortable with what ever she would like to do.  All of her questions were answered to her content.

## 2021-03-11 ENCOUNTER — Ambulatory Visit
Admission: RE | Admit: 2021-03-11 | Discharge: 2021-03-11 | Disposition: A | Discharge status: Home / Self Care | Payer: BC Managed Care – PPO | Source: Ambulatory Visit | Attending: Radiation Oncology | Admitting: Radiation Oncology

## 2021-03-11 DIAGNOSIS — C50812 Malignant neoplasm of overlapping sites of left female breast: Secondary | ICD-10-CM | POA: Diagnosis not present

## 2021-03-12 ENCOUNTER — Ambulatory Visit
Admission: RE | Admit: 2021-03-12 | Discharge: 2021-03-12 | Disposition: A | Discharge status: Home / Self Care | Payer: BC Managed Care – PPO | Source: Ambulatory Visit | Attending: Radiation Oncology | Admitting: Radiation Oncology

## 2021-03-12 DIAGNOSIS — C50812 Malignant neoplasm of overlapping sites of left female breast: Secondary | ICD-10-CM | POA: Diagnosis not present

## 2021-03-13 ENCOUNTER — Ambulatory Visit
Admission: RE | Admit: 2021-03-13 | Discharge: 2021-03-13 | Disposition: A | Discharge status: Home / Self Care | Payer: BC Managed Care – PPO | Source: Ambulatory Visit | Attending: Radiation Oncology | Admitting: Radiation Oncology

## 2021-03-13 DIAGNOSIS — C50812 Malignant neoplasm of overlapping sites of left female breast: Secondary | ICD-10-CM | POA: Diagnosis not present

## 2021-04-02 ENCOUNTER — Other Ambulatory Visit: Payer: Self-pay

## 2021-04-02 ENCOUNTER — Ambulatory Visit (INDEPENDENT_AMBULATORY_CARE_PROVIDER_SITE_OTHER): Payer: BC Managed Care – PPO | Admitting: Plastic Surgery

## 2021-04-02 ENCOUNTER — Encounter: Payer: Self-pay | Admitting: Oncology

## 2021-04-02 ENCOUNTER — Inpatient Hospital Stay: Payer: BC Managed Care – PPO

## 2021-04-02 ENCOUNTER — Inpatient Hospital Stay: Payer: BC Managed Care – PPO | Attending: Oncology | Admitting: Oncology

## 2021-04-02 ENCOUNTER — Encounter: Payer: Self-pay | Admitting: Plastic Surgery

## 2021-04-02 VITALS — BP 132/72 | HR 85 | Temp 96.7°F | Resp 18 | Wt 251.6 lb

## 2021-04-02 DIAGNOSIS — Z803 Family history of malignant neoplasm of breast: Secondary | ICD-10-CM | POA: Insufficient documentation

## 2021-04-02 DIAGNOSIS — Z170 Estrogen receptor positive status [ER+]: Secondary | ICD-10-CM

## 2021-04-02 DIAGNOSIS — N6092 Unspecified benign mammary dysplasia of left breast: Secondary | ICD-10-CM | POA: Diagnosis not present

## 2021-04-02 DIAGNOSIS — Z9049 Acquired absence of other specified parts of digestive tract: Secondary | ICD-10-CM | POA: Insufficient documentation

## 2021-04-02 DIAGNOSIS — Z79899 Other long term (current) drug therapy: Secondary | ICD-10-CM | POA: Diagnosis not present

## 2021-04-02 DIAGNOSIS — Z780 Asymptomatic menopausal state: Secondary | ICD-10-CM

## 2021-04-02 DIAGNOSIS — Z801 Family history of malignant neoplasm of trachea, bronchus and lung: Secondary | ICD-10-CM | POA: Insufficient documentation

## 2021-04-02 DIAGNOSIS — M2550 Pain in unspecified joint: Secondary | ICD-10-CM | POA: Insufficient documentation

## 2021-04-02 DIAGNOSIS — Z800 Family history of malignant neoplasm of digestive organs: Secondary | ICD-10-CM | POA: Insufficient documentation

## 2021-04-02 DIAGNOSIS — I10 Essential (primary) hypertension: Secondary | ICD-10-CM | POA: Diagnosis not present

## 2021-04-02 DIAGNOSIS — C50812 Malignant neoplasm of overlapping sites of left female breast: Secondary | ICD-10-CM

## 2021-04-02 DIAGNOSIS — N6489 Other specified disorders of breast: Secondary | ICD-10-CM | POA: Diagnosis not present

## 2021-04-02 DIAGNOSIS — Z8249 Family history of ischemic heart disease and other diseases of the circulatory system: Secondary | ICD-10-CM | POA: Diagnosis not present

## 2021-04-02 DIAGNOSIS — Z808 Family history of malignant neoplasm of other organs or systems: Secondary | ICD-10-CM | POA: Insufficient documentation

## 2021-04-02 NOTE — Progress Notes (Signed)
The patient is a 65 year old female here for follow-up on her breast reduction oncoplastic for cancer.  She had the surgery in May and overall is doing really well.  She was radiated on the left breast.  She had some opening on the right breast.  Today it is about 2 x 4 mm.  No sign of infection.  Overall she is doing extremely well.  She is going to continue with Vaseline dressings.  She is planning on having a gastric bypass and then will want to do an abdominoplasty and a brachioplasty.  I recommend she come and see [place] after her gastric bypass and we can talk about that further.  Call with any questions or concerns and certainly let us know if the right breast does not completely closed up.  It looks so good today that I do not want to do anything to disrupt it.  Pictures were obtained of the patient and placed in the chart with the patient's or guardian's permission.

## 2021-04-02 NOTE — Progress Notes (Signed)
Pt here for follow up. No new concerns voiced.   

## 2021-04-02 NOTE — Progress Notes (Signed)
[place]  Hematology oncology progress note   Clinic Day:  04/02/2021  Referring physician: Ulyess Blossom, PA  Chief Complaint: Holly Henderson is a 65 y.o. female presents for follow-up of left breast lobular carcinoma.  PERTINENT ONCOLOGY HISTORY Holly Henderson is a 65 y.o.afemale who has above oncology history reviewed by me today presented for follow up visit for management of left breast lobular carcinoma  Patient previously followed up by Dr.Corcoran, patient switched care to me on 12/22/20 Extensive medical records review was performed by me 10/11/2020 Bilateral screening mammogram on  revealed asymmetry with distortion in the left breast.   10/29/2020 Left diagnostic mammogram  revealed focal asymmetry in the upper-outer quadrant of the LEFT breast, with possible subtle associated architectural distortion.  11/05/2020, left upper outer quadrant breast mass biopsy showed grade 1 invasive lobular carcinoma, ER 90%, PR 90%, HER2 negative IHC  11/16/2020 bilateral breast MRI revealed suspicious findings in the left breast. Recommended 3 site biopsy in the left breast: 1) 0.5 cm mass medial upper left breast. 2) 1 cm area of focal enhancement posterior lateral lower left breast. 3) Most posterior aspect of enhancement in the previously biopsy tumor area to assess posterior extent of disease.  11/29/2020 patient underwent additional biopsy of the left breast Lateral mid left breast needle biopsy  revealed invasive lobular carcinoma with DCIS and lobular neoplasia- (atypical lobular hyperplasia) Lower outer left breast needle biopsy revealed a focus suspicious for perineural invasion and lobular neoplasia (atypical lobular hyperplasia).  Upper inner left breast biopsy revealed focal fat necrosis with associated inflammation. Tumor cells were ER + (85%), PR + (95%), HER2 - (1+). Ki67 was 2%.  Patient's case was discussed on breast tumor board 12/17/2020.  Area of concern spans 11-12 centimeters in all sites will need to be resected.  Patient has established care with Dr. Lysle Pearl and Dr. Marla Roe. Patient has decided on bilateral oncoplastic breast reduction.  # 01/02/2021, patient underwent bilateral oncoplastic breast reduction Pathology showed DIAGNOSIS:  A. BREAST, LEFT; EXCISION: - BENIGN MAMMARY PARENCHYMA WITH FOCAL LOBULAR NEOPLASIA.  - CLIPS X2 (DUMBBELL AND CYLINDER), RF TAGS X2, AND BIOPSY SITES IDENTIFIED.  - NO EVIDENCE OF RESIDUAL INVASIVE CARCINOMA.  B. BREAST, LEFT LATERAL; EXCISION:  - INVASIVE LOBULAR CARCINOMA. - SINGLE RF TAG IDENTIFIED; NO BIOPSY CLIPS PRESENT. - BACKGROUND LOBULAR NEOPLASIA.  C. BREAST, LEFT INFERIOR/POSTERIOR; EXCISION:  - LOBULAR NEOPLASIA. - CLIPS X2 (DUMBBELL AND X), RF TAG X1, AND BIOPSY SITES IDENTIFIED. - NO EVIDENCE OF RESIDUAL INVASIVE CARCINOMA. - SEE CANCER SUMMARY.  D. LYMPH NODE, LEFT SENTINEL #1; EXCISION:  - TWO LYMPH NODES, NEGATIVE FOR MALIGNANCY (0/2).  E. BREAST, RIGHT; MASTOPEXY:  - BENIGN SKIN AND MAMMARY PARENCHYMA. - NEGATIVE FOR ATYPICAL PROLIFERATIVE BREAST DISEASE.  F. BREAST, LEFT; MASTOPEXY:  - BENIGN SKIN AND SUBCUTANEOUS TISSUE. - NEGATIVE FOR MALIGNANCY.    # Genetic testing negative.   INTERVAL HISTORY Holly Henderson is a 65 y.o. female who has above history reviewed by me today presents for follow up visit for left breast lobular carcinoma Status post adjuvant radiation.  Patient has developed radiation dermatitis which has now almost resolved.  No other new concerns today. Patient has had a history of endometrial ablation.     Review of Systems  Constitutional:  Negative for chills and fever.  HENT:   Negative for sore throat.   Eyes:  Negative for eye problems.  Respiratory:  Negative for cough, shortness of breath and wheezing.  Cardiovascular:  Negative for chest pain and palpitations.  Gastrointestinal:  Negative for abdominal pain, blood in stool,  nausea and vomiting.  Endocrine: Negative for hot flashes.  Genitourinary:  Negative for dysuria.   Musculoskeletal:  Positive for arthralgias. Negative for myalgias.  Skin:  Negative for rash.  Neurological:  Negative for dizziness.  Hematological:  Does not bruise/bleed easily.    Past Medical History:  Diagnosis Date   Diabetes mellitus without complication ([place])    Family history of breast cancer    Family history of colon cancer    Family history of glioblastoma    Hypertension    Malignant neoplasm of upper-outer quadrant of left female breast ([place]) 11/13/2020    Past Surgical History:  Procedure Laterality Date   BREAST BIOPSY Left 11/05/2020   stereo bx, x-clip, path pending    BREAST REDUCTION SURGERY Bilateral 01/02/2021   Procedure: BILATERAL ONCOPLASTIC BREAST REDUCTION;  Surgeon: Wallace Going, DO;  Location: [facility] ORS;  Service: Plastics;  Laterality: Bilateral;   CHOLECYSTECTOMY     COLONOSCOPY     dental work     FRACTURE SURGERY     left - right arm - left arm   PART MASTECTOMY,RADIO FREQUENCY LOCALIZER,AXILLARY SENTINEL NODE BIOPSY Left 01/02/2021   Procedure: PART MASTECTOMY,RADIO FREQUENCY LOCALIZER,AXILLARY SENTINEL NODE BIOPSY;  Surgeon: Benjamine Sprague, DO;  Location: [facility] OR[facility];  Service: General;  Laterality: Left;   TOTAL KNEE ARTHROPLASTY Left 01/26/2020   Procedure: TOTAL KNEE ARTHROPLASTY;  Surgeon: Corky Mull, MD;  Location: [facility] OR[facility];  Service: Orthopedics;  Laterality: Left;    Family History  Problem Relation Age of Onset   Breast cancer Mother 22   Cancer - Other Father        glioblastoma x2, d. 70   Lung cancer Maternal Grandmother        hx smoking   AAA (abdominal aortic aneurysm) Maternal Grandfather    Colon cancer Paternal Grandfather        dx 38s, d. 7s   Breast cancer Other     Social History:  reports that she has never smoked. She has never used smokeless tobacco. She reports current alcohol use. She reports that she does  not use drugs. She has never smoked. She has not had any alcohol since she was diagnosed with diabetes. She denies exposure to radiation or toxins. She has 6 children. She previously lived in [place]. 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 The patient is alone today.  Allergies:  Allergies  Allergen Reactions   Latex Other (See Comments)    Irritation/gyn    Current Medications: Current Outpatient Medications  Medication Sig Dispense Refill   Calcium Carbonate (CALCIUM 600 PO) Take 2 tablets by mouth daily.     cholecalciferol (VITAMIN D3) 25 MCG (1000 UNIT) tablet Take 1,000 Units by mouth daily.     Dulaglutide 0.75 MG/0.5ML SOPN Inject 0.75 mg into the skin every Monday.     losartan (COZAAR) 50 MG tablet Take 50 mg by mouth daily.     metFORMIN (GLUCOPHAGE) 1000 MG tablet Take 1,000 mg by mouth 2 (two) times daily with a meal.     Multiple Vitamins-Minerals (MULTIVITAMIN WITH MINERALS) tablet Take 1 tablet by mouth daily.     spironolactone (ALDACTONE) 25 MG tablet Take 25 mg by mouth daily.     VITAMIN D PO Take by mouth.     No current facility-administered medications for this visit.     Performance status (ECOG):  1  Vitals Blood pressure 132/72, pulse 85, temperature (!) 96.7 F (35.9 C), resp. rate 18, weight 251 lb 9.6 oz (114.1 kg).   Physical Exam Vitals and nursing note reviewed.  Constitutional:      General: She is not in acute distress.    Appearance: She is not diaphoretic.  Eyes:     General: No scleral icterus.    Conjunctiva/sclera: Conjunctivae normal.  Cardiovascular:     Rate and Rhythm: Normal rate and regular rhythm.     Pulses: Normal pulses.  Pulmonary:     Effort: Pulmonary effort is normal.     Breath sounds: Normal breath sounds.  Abdominal:     General: There is no distension.  Musculoskeletal:        General: No swelling.     Cervical back: Normal range of motion.  Skin:    General: Skin is warm and dry.  Neurological:      General: No focal deficit present.     Mental Status: She is alert and oriented to person, place, and time.  Psychiatric:        Mood and Affect: Mood normal.     Laboratory findings CBC    Component Value Date/Time   WBC 7.6 03/01/2021 1113   RBC 4.38 03/01/2021 1113   HGB 13.6 03/01/2021 1113   HCT 41.2 03/01/2021 1113   PLT 225 03/01/2021 1113   MCV 94.1 03/01/2021 1113   MCH 31.1 03/01/2021 1113   MCHC 33.0 03/01/2021 1113   RDW 13.3 03/01/2021 1113   LYMPHSABS 1.4 11/13/2020 1208   MONOABS 0.7 11/13/2020 1208   EOSABS 0.2 11/13/2020 1208   BASOSABS 0.0 11/13/2020 1208   CMP Latest Ref Rng & Units 11/13/2020 01/29/2020 01/28/2020  Glucose 70 - 99 mg/dL 120(H) 134(H) 155(H)  BUN 8 - 23 mg/dL 15 11 15   Creatinine 0.44 - 1.00 mg/dL 0.64 0.69 0.64  Sodium 135 - 145 mmol/L 138 135 136  Potassium 3.5 - 5.1 mmol/L 4.1 4.7 4.4  Chloride 98 - 111 mmol/L 107 101 104  CO2 22 - 32 mmol/L 23 28 25   Calcium 8.9 - 10.3 mg/dL 9.3 8.7(L) 8.8(L)  Total Protein 6.5 - 8.1 g/dL 7.3 - -  Total Bilirubin 0.3 - 1.2 mg/dL 0.9 - -  Alkaline Phos 38 - 126 U/L 66 - -  AST 15 - 41 U/L 24 - -  ALT 0 - 44 U/L 32 - -     Preoperation CA 27-29 is normal 28.6  Assessment:  Holly Henderson is a 65 y.o. female with left breast cancer s/p biopsy on 11/05/2020.  Pathology revealed a 6 mm, grade I, invasive lobular carcinoma, classic type.  ER/PR positive, HER2 negative 1. Malignant neoplasm of overlapping sites of left breast in female, estrogen receptor positive ([place])   2. Postmenopausal    Cancer Staging Malignant neoplasm of overlapping sites of left breast in female, estrogen receptor positive ([place]) Staging form: Breast, AJCC 8th Edition - Clinical stage from 12/21/2020: Stage Unknown (cTX, cN0, cM0, G2, ER+, PR+, HER2-) - Signed by Earlie Server, MD on 12/22/2020     # Invasive lobular left breast cancer, ER/PR +, HER2 - pT1bN0-  Oncotype DX recurrence score 17 status post bilateral oncoplastic  reduction-adjuvant radiation. Recommend patient to check for [facility] and LH level to determine menopausal status. Discussed about the rationale of antiestrogen treatments and potential side effects. Addendum FSH and estradiol levels are in post menopausal range. recommend arimidex 26m daily.  01/09/2021, DEXA showed normal bone density.  Recommend over-the-counter calcium and vitamin D limitation.  All questions were answered to patient's satisfaction.   RTC  6 weeks for evaluation of tolerability  Earlie Server, MD, PhD Hematology Oncology [place] [facility] 04/02/2021

## 2021-04-03 LAB — ESTRADIOL: Estradiol: <5 pg/mL

## 2021-04-03 LAB — FOLLICLE STIMULATING HORMONE: FSH: 41.9 m[IU]/mL

## 2021-04-04 ENCOUNTER — Other Ambulatory Visit: Payer: Self-pay | Admitting: Oncology

## 2021-04-04 MED ORDER — ANASTROZOLE 1 MG PO TABS: 1.0000 mg | ORAL_TABLET | Freq: Every day | ORAL | 1 refills | Status: DC

## 2021-04-05 ENCOUNTER — Telehealth: Payer: Self-pay

## 2021-04-05 NOTE — Telephone Encounter (Signed)
Holly Henderson please schedule appts as MD recommends, we will call her with results and appt info.

## 2021-04-05 NOTE — Telephone Encounter (Signed)
04/05/2021 Spoke w/ pt and informed her of 6 month f/u appts for lab/MD on 2/14. Pt confirmed appt SRW

## 2021-04-05 NOTE — Telephone Encounter (Signed)
Per result note from Dr. Tasia Catchings: Let her know that labs showed that she is postmenopausal.  Recommend her to start arimidex. Rx sent to pharmacy.  Follow up in 6 months, cbc cmp thanks.   Detailed VM left for patient, Please schedule lab/MD in 6 months (cbc,cmp) ... ejs

## 2021-04-18 ENCOUNTER — Ambulatory Visit
Admission: RE | Admit: 2021-04-18 | Discharge: 2021-04-18 | Disposition: A | Discharge status: Home / Self Care | Payer: BC Managed Care – PPO | Source: Ambulatory Visit | Attending: Radiation Oncology | Admitting: Radiation Oncology

## 2021-04-18 VITALS — Temp 97.6°F | Wt 246.0 lb

## 2021-04-18 DIAGNOSIS — Z170 Estrogen receptor positive status [ER+]: Secondary | ICD-10-CM

## 2021-04-18 DIAGNOSIS — C50812 Malignant neoplasm of overlapping sites of left female breast: Secondary | ICD-10-CM

## 2021-04-18 NOTE — Progress Notes (Signed)
Radiation Oncology Follow up Note  Name: Holly Henderson   Date:   04/18/2021 MRN:  [id number] DOB: [birth year]    This 65 y.o. female presents to the clinic today for 1 month follow-up status post whole breast radiation for stage Ia invasive mammary carcinoma of the left breast status post wide local excision.  REFERRING PROVIDER: Ulyess Blossom, PA  HPI: Patient is a 65 year old female now at 1 month having completed whole breast radiation to her left breast for ER/PR positive invasive mammary carcinoma stage Ia.  Seen today in routine follow-up she is doing well.  She specifically denies breast tenderness cough or bone pain..  She has been started on Arimidex tolerant well without side effect COMPLICATIONS OF TREATMENT: none  FOLLOW UP COMPLIANCE: keeps appointments   PHYSICAL EXAM:  Temp 97.6 F (36.4 C) (Tympanic)   Wt 246 lb (111.6 kg)   BMI 42.23 kg/m  Lungs are clear to A&P cardiac examination essentially unremarkable with regular rate and rhythm. No dominant mass or nodularity is noted in either breast in 2 positions examined. Incision is well-healed. No axillary or supraclavicular adenopathy is appreciated. Cosmetic result is excellent.  Well-developed well-nourished patient in NAD. HEENT reveals PERLA, EOMI, discs not visualized.  Oral cavity is clear. No oral mucosal lesions are identified. Neck is clear without evidence of cervical or supraclavicular adenopathy. Lungs are clear to A&P. Cardiac examination is essentially unremarkable with regular rate and rhythm without murmur rub or thrill. Abdomen is benign with no organomegaly or masses noted. Motor sensory and DTR levels are equal and symmetric in the upper and lower extremities. Cranial nerves II through XII are grossly intact. Proprioception is intact. No peripheral adenopathy or edema is identified. No motor or sensory levels are noted. Crude visual fields are within normal range.  RADIOLOGY RESULTS: No current  films for review  PLAN: Present time patient is doing well 1 month out very low side effect profile.  On pleased with her overall progress.  She continues on Arimidex without side effect.  I have asked to see her back in 4 to 5 months for follow-up.  Patient knows to call with any concerns.  I would like to take this opportunity to thank you for allowing me to participate in the care of your patient.Noreene Filbert, MD

## 2021-05-09 ENCOUNTER — Telehealth: Payer: Self-pay | Admitting: *Deleted

## 2021-05-09 NOTE — Telephone Encounter (Signed)
Received a call from Bariatric Surgeon office asking if the letter from July tha tpt is ok to proceed with surgery is still good and to get the last radiation therapy treatment date. Please return her call

## 2021-05-10 NOTE — Telephone Encounter (Signed)
Benjamine Mola, can you send something in writing that it is ok for her to have surgery and include her radiation therapy completion date on it. Thank you

## 2021-05-10 NOTE — Telephone Encounter (Signed)
Contacted [facility] and spoke to [place]. Notified her that last radiation tx date was on 03/13/21 and that it is still ok to proceed with surgery, per MD. She did not need a form faxed, took verbal confirmation.

## 2021-06-02 ENCOUNTER — Other Ambulatory Visit: Payer: Self-pay | Admitting: Oncology

## 2021-06-02 ENCOUNTER — Encounter: Payer: Self-pay | Admitting: *Deleted

## 2021-07-05 ENCOUNTER — Other Ambulatory Visit: Payer: Self-pay | Admitting: Oncology

## 2021-08-05 ENCOUNTER — Other Ambulatory Visit: Payer: Self-pay

## 2021-08-05 ENCOUNTER — Telehealth: Payer: Self-pay

## 2021-08-05 ENCOUNTER — Other Ambulatory Visit: Payer: Self-pay | Admitting: Oncology

## 2021-08-05 MED ORDER — ANASTROZOLE 1 MG PO TABS
1.0000 mg | ORAL_TABLET | Freq: Every day | ORAL | 2 refills | Status: DC
Start: 2021-08-05 — End: 2021-09-10

## 2021-08-05 NOTE — Telephone Encounter (Signed)
Anastrozole refill sent to pt's pharmacy. Please moe up apps to Holly Henderson (next availa appts) and inform pt of appts. She's aware that appts will be moved up.

## 2021-09-10 ENCOUNTER — Encounter: Payer: Self-pay | Admitting: Oncology

## 2021-09-10 ENCOUNTER — Inpatient Hospital Stay: Payer: BC Managed Care – PPO | Attending: Oncology

## 2021-09-10 ENCOUNTER — Inpatient Hospital Stay: Payer: BC Managed Care – PPO | Admitting: Oncology

## 2021-09-10 ENCOUNTER — Other Ambulatory Visit: Payer: Self-pay

## 2021-09-10 VITALS — BP 174/76 | HR 67 | Temp 97.6°F | Resp 18 | Wt 202.1 lb

## 2021-09-10 DIAGNOSIS — Z9884 Bariatric surgery status: Secondary | ICD-10-CM | POA: Insufficient documentation

## 2021-09-10 DIAGNOSIS — C50812 Malignant neoplasm of overlapping sites of left female breast: Secondary | ICD-10-CM

## 2021-09-10 DIAGNOSIS — Z800 Family history of malignant neoplasm of digestive organs: Secondary | ICD-10-CM | POA: Insufficient documentation

## 2021-09-10 DIAGNOSIS — I10 Essential (primary) hypertension: Secondary | ICD-10-CM | POA: Diagnosis not present

## 2021-09-10 DIAGNOSIS — Z801 Family history of malignant neoplasm of trachea, bronchus and lung: Secondary | ICD-10-CM | POA: Insufficient documentation

## 2021-09-10 DIAGNOSIS — Z780 Asymptomatic menopausal state: Secondary | ICD-10-CM

## 2021-09-10 DIAGNOSIS — Z79811 Long term (current) use of aromatase inhibitors: Secondary | ICD-10-CM | POA: Diagnosis not present

## 2021-09-10 DIAGNOSIS — M2550 Pain in unspecified joint: Secondary | ICD-10-CM | POA: Insufficient documentation

## 2021-09-10 DIAGNOSIS — Z170 Estrogen receptor positive status [ER+]: Secondary | ICD-10-CM | POA: Diagnosis not present

## 2021-09-10 DIAGNOSIS — Z79899 Other long term (current) drug therapy: Secondary | ICD-10-CM | POA: Diagnosis not present

## 2021-09-10 DIAGNOSIS — Z8249 Family history of ischemic heart disease and other diseases of the circulatory system: Secondary | ICD-10-CM | POA: Insufficient documentation

## 2021-09-10 DIAGNOSIS — Z9049 Acquired absence of other specified parts of digestive tract: Secondary | ICD-10-CM | POA: Diagnosis not present

## 2021-09-10 DIAGNOSIS — C50412 Malignant neoplasm of upper-outer quadrant of left female breast: Secondary | ICD-10-CM | POA: Insufficient documentation

## 2021-09-10 DIAGNOSIS — N6489 Other specified disorders of breast: Secondary | ICD-10-CM | POA: Diagnosis not present

## 2021-09-10 DIAGNOSIS — Z803 Family history of malignant neoplasm of breast: Secondary | ICD-10-CM | POA: Insufficient documentation

## 2021-09-10 DIAGNOSIS — N6092 Unspecified benign mammary dysplasia of left breast: Secondary | ICD-10-CM | POA: Insufficient documentation

## 2021-09-10 LAB — CBC WITH DIFFERENTIAL/PLATELET
Abs Immature Granulocytes: 0.02 10*3/uL (ref 0.00–0.07)
Basophils Absolute: 0 10*3/uL (ref 0.0–0.1)
Basophils Relative: 1 %
Eosinophils Absolute: 0.6 10*3/uL — ABNORMAL HIGH (ref 0.0–0.5)
Eosinophils Relative: 10 %
HCT: 37.1 % (ref 36.0–46.0)
Hemoglobin: 12.3 g/dL (ref 12.0–15.0)
Immature Granulocytes: 0 %
Lymphocytes Relative: 27 %
Lymphs Abs: 1.5 10*3/uL (ref 0.7–4.0)
MCH: 30.2 pg (ref 26.0–34.0)
MCHC: 33.2 g/dL (ref 30.0–36.0)
MCV: 91.2 fL (ref 80.0–100.0)
Monocytes Absolute: 0.5 10*3/uL (ref 0.1–1.0)
Monocytes Relative: 9 %
Neutro Abs: 3.1 10*3/uL (ref 1.7–7.7)
Neutrophils Relative %: 53 %
Platelets: 198 10*3/uL (ref 150–400)
RBC: 4.07 MIL/uL (ref 3.87–5.11)
RDW: 14.2 % (ref 11.5–15.5)
WBC: 5.7 10*3/uL (ref 4.0–10.5)
nRBC: 0 % (ref 0.0–0.2)

## 2021-09-10 LAB — COMPREHENSIVE METABOLIC PANEL
ALT: 27 U/L (ref 0–44)
AST: 29 U/L (ref 15–41)
Albumin: 4.1 g/dL (ref 3.5–5.0)
Alkaline Phosphatase: 75 U/L (ref 38–126)
Anion gap: 7 (ref 5–15)
BUN: 16 mg/dL (ref 8–23)
CO2: 26 mmol/L (ref 22–32)
Calcium: 9.6 mg/dL (ref 8.9–10.3)
Chloride: 108 mmol/L (ref 98–111)
Creatinine, Ser: 0.65 mg/dL (ref 0.44–1.00)
GFR, Estimated: >60 mL/min (ref >60)
Glucose, Bld: 109 mg/dL — ABNORMAL HIGH (ref 70–99)
Potassium: 3.6 mmol/L (ref 3.5–5.1)
Sodium: 141 mmol/L (ref 135–145)
Total Bilirubin: 0.6 mg/dL (ref 0.3–1.2)
Total Protein: 7.1 g/dL (ref 6.5–8.1)

## 2021-09-10 MED ORDER — ANASTROZOLE 1 MG PO TABS: 1.0000 mg | ORAL_TABLET | Freq: Every day | ORAL | 5 refills | Status: DC

## 2021-09-10 NOTE — Progress Notes (Signed)
[facility] oncology progress note   Clinic Day:  09/10/2021  Referring physician: Ulyess Blossom, PA  Chief Complaint: Holly Henderson is a 66 y.o. female presents for follow-up of left breast lobular carcinoma.  PERTINENT ONCOLOGY HISTORY Holly Henderson is a 66 y.o.afemale who has above oncology history reviewed by me today presented for follow up visit for management of left breast lobular carcinoma  Patient previously followed up by Dr.Corcoran, patient switched care to me on 12/22/20 Extensive medical records review was performed by me 10/11/2020 Bilateral screening mammogram on  revealed asymmetry with distortion in the left breast.   10/29/2020 Left diagnostic mammogram  revealed focal asymmetry in the upper-outer quadrant of the LEFT breast, with possible subtle associated architectural distortion.  11/05/2020, left upper outer quadrant breast mass biopsy showed grade 1 invasive lobular carcinoma, ER 90%, PR 90%, HER2 negative IHC  11/16/2020 bilateral breast MRI revealed suspicious findings in the left breast. Recommended 3 site biopsy in the left breast: 1) 0.5 cm mass medial upper left breast. 2) 1 cm area of focal enhancement posterior lateral lower left breast. 3) Most posterior aspect of enhancement in the previously biopsy tumor area to assess posterior extent of disease.  11/29/2020 patient underwent additional biopsy of the left breast Lateral mid left breast needle biopsy  revealed invasive lobular carcinoma with DCIS and lobular neoplasia- (atypical lobular hyperplasia) Lower outer left breast needle biopsy revealed a focus suspicious for perineural invasion and lobular neoplasia (atypical lobular hyperplasia).  Upper inner left breast biopsy revealed focal fat necrosis with associated inflammation. Tumor cells were ER + (85%), PR + (95%), HER2 - (1+). Ki67 was 2%.  Patient's case was discussed on breast tumor board 12/17/2020.  Area of concern spans 11-12 centimeters in all sites will need to be resected.  Patient has established care with Dr. Lysle Pearl and Dr. Marla Roe. Patient has decided on bilateral oncoplastic breast reduction.  # 01/02/2021, patient underwent bilateral oncoplastic breast reduction Pathology showed DIAGNOSIS:  A. BREAST, LEFT; EXCISION: - BENIGN MAMMARY PARENCHYMA WITH FOCAL LOBULAR NEOPLASIA.  - CLIPS X2 (DUMBBELL AND CYLINDER), RF TAGS X2, AND BIOPSY SITES IDENTIFIED.  - NO EVIDENCE OF RESIDUAL INVASIVE CARCINOMA.  B. BREAST, LEFT LATERAL; EXCISION:  - INVASIVE LOBULAR CARCINOMA. - SINGLE RF TAG IDENTIFIED; NO BIOPSY CLIPS PRESENT. - BACKGROUND LOBULAR NEOPLASIA.  C. BREAST, LEFT INFERIOR/POSTERIOR; EXCISION:  - LOBULAR NEOPLASIA. - CLIPS X2 (DUMBBELL AND X), RF TAG X1, AND BIOPSY SITES IDENTIFIED. - NO EVIDENCE OF RESIDUAL INVASIVE CARCINOMA. - SEE CANCER SUMMARY.  D. LYMPH NODE, LEFT SENTINEL #1; EXCISION:  - TWO LYMPH NODES, NEGATIVE FOR MALIGNANCY (0/2).  E. BREAST, RIGHT; MASTOPEXY:  - BENIGN SKIN AND MAMMARY PARENCHYMA. - NEGATIVE FOR ATYPICAL PROLIFERATIVE BREAST DISEASE.  F. BREAST, LEFT; MASTOPEXY:  - BENIGN SKIN AND SUBCUTANEOUS TISSUE. - NEGATIVE FOR MALIGNANCY.    # Genetic testing negative. #Patient received adjuvant radiation and was started on adjuvant endocrine therapy with Arimidex after her 04/02/2021 visits.   INTERVAL HISTORY Holly Henderson is a 66 y.o. female who has above history reviewed by me today presents for follow up visit for left breast lobular carcinoma Patient has been on Arimidex since August 2023.  She reports feeling well During interval, patient is status post gastric bypass surgery.  She tolerates the procedure well.  She has lost about 50 pounds since last visit.  She feels better.  She is currently off blood pressure medication and has an appointment with  primary care provider.    Review of Systems  Constitutional:  Negative for chills and  fever.  HENT:   Negative for sore throat.   Eyes:  Negative for eye problems.  Respiratory:  Negative for cough, shortness of breath and wheezing.   Cardiovascular:  Negative for chest pain and palpitations.  Gastrointestinal:  Negative for abdominal pain, blood in stool, nausea and vomiting.  Endocrine: Negative for hot flashes.  Genitourinary:  Negative for dysuria.   Musculoskeletal:  Positive for arthralgias. Negative for myalgias.  Skin:  Negative for rash.  Neurological:  Negative for dizziness.  Hematological:  Does not bruise/bleed easily.    Past Medical History:  Diagnosis Date   Diabetes mellitus without complication ([place])    Family history of breast cancer    Family history of colon cancer    Family history of glioblastoma    Hypertension    Malignant neoplasm of upper-outer quadrant of left female breast ([place]) 11/13/2020    Past Surgical History:  Procedure Laterality Date   BREAST BIOPSY Left 11/05/2020   stereo bx, x-clip, path pending    BREAST REDUCTION SURGERY Bilateral 01/02/2021   Procedure: BILATERAL ONCOPLASTIC BREAST REDUCTION;  Surgeon: Wallace Going, DO;  Location: [facility] OR[facility];  Service: Plastics;  Laterality: Bilateral;   CHOLECYSTECTOMY     COLONOSCOPY     dental work     FRACTURE SURGERY     left - right arm - left arm   PART MASTECTOMY,RADIO FREQUENCY LOCALIZER,AXILLARY SENTINEL NODE BIOPSY Left 01/02/2021   Procedure: PART MASTECTOMY,RADIO FREQUENCY LOCALIZER,AXILLARY SENTINEL NODE BIOPSY;  Surgeon: Benjamine Sprague, DO;  Location: [facility];  Service: General;  Laterality: Left;   TOTAL KNEE ARTHROPLASTY Left 01/26/2020   Procedure: TOTAL KNEE ARTHROPLASTY;  Surgeon: Corky Mull, MD;  Location: [facility];  Service: Orthopedics;  Laterality: Left;    Family History  Problem Relation Age of Onset   Breast cancer Mother 63   Cancer - Other Father        glioblastoma x2, d. 26   Lung cancer Maternal Grandmother        hx smoking   AAA (abdominal  aortic aneurysm) Maternal Grandfather    Colon cancer Paternal Grandfather        dx 71s, d. 79s   Breast cancer Other     Social History:  reports that she has never smoked. She has never used smokeless tobacco. She reports current alcohol use. She reports that she does not use drugs. She has never smoked. She has not had any alcohol since she was diagnosed with diabetes. She denies exposure to radiation or toxins. She has 6 children. She previously lived in [place]. 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 The patient is alone today.  Allergies:  Allergies  Allergen Reactions   Latex Other (See Comments)    Irritation/gyn    Current Medications: Current Outpatient Medications  Medication Sig Dispense Refill   losartan (COZAAR) 50 MG tablet Take 50 mg by mouth daily.     Multiple Vitamins-Minerals (MULTIVITAMIN WITH MINERALS) tablet Take 1 tablet by mouth daily.     pantoprazole (PROTONIX) 40 MG tablet Take 40 mg by mouth daily.     VITAMIN D PO Take by mouth.     anastrozole (ARIMIDEX) 1 MG tablet Take 1 tablet (1 mg total) by mouth daily. 30 tablet 5   No current facility-administered medications for this visit.     Performance status (ECOG): 1  Vitals Blood pressure Marland Kitchen)  174/76, pulse 67, temperature 97.6 F (36.4 C), resp. rate 18, weight 202 lb 1.6 oz (91.7 kg).   Physical Exam Vitals and nursing note reviewed.  Constitutional:      General: She is not in acute distress.    Appearance: She is not diaphoretic.  Eyes:     General: No scleral icterus.    Conjunctiva/sclera: Conjunctivae normal.  Cardiovascular:     Rate and Rhythm: Normal rate and regular rhythm.     Pulses: Normal pulses.  Pulmonary:     Effort: Pulmonary effort is normal.     Breath sounds: Normal breath sounds.  Abdominal:     General: There is no distension.  Musculoskeletal:        General: No swelling.     Cervical back: Normal range of motion.  Skin:    General: Skin is warm and dry.   Neurological:     General: No focal deficit present.     Mental Status: She is alert and oriented to person, place, and time.  Psychiatric:        Mood and Affect: Mood normal.     Laboratory findings CBC    Component Value Date/Time   WBC 5.7 09/10/2021 1300   RBC 4.07 09/10/2021 1300   HGB 12.3 09/10/2021 1300   HCT 37.1 09/10/2021 1300   PLT 198 09/10/2021 1300   MCV 91.2 09/10/2021 1300   MCH 30.2 09/10/2021 1300   MCHC 33.2 09/10/2021 1300   RDW 14.2 09/10/2021 1300   LYMPHSABS 1.5 09/10/2021 1300   MONOABS 0.5 09/10/2021 1300   EOSABS 0.6 (H) 09/10/2021 1300   BASOSABS 0.0 09/10/2021 1300   CMP Latest Ref Rng & Units 09/10/2021 11/13/2020 01/29/2020  Glucose 70 - 99 mg/dL 109(H) 120(H) 134(H)  BUN 8 - 23 mg/dL 16 15 11   Creatinine 0.44 - 1.00 mg/dL 0.65 0.64 0.69  Sodium 135 - 145 mmol/L 141 138 135  Potassium 3.5 - 5.1 mmol/L 3.6 4.1 4.7  Chloride 98 - 111 mmol/L 108 107 101  CO2 22 - 32 mmol/L 26 23 28   Calcium 8.9 - 10.3 mg/dL 9.6 9.3 8.7(L)  Total Protein 6.5 - 8.1 g/dL 7.1 7.3 -  Total Bilirubin 0.3 - 1.2 mg/dL 0.6 0.9 -  Alkaline Phos 38 - 126 U/L 75 66 -  AST 15 - 41 U/L 29 24 -  ALT 0 - 44 U/L 27 32 -     Preoperation CA 27-29 is normal 28.6  Assessment and Plan. 1. Malignant neoplasm of overlapping sites of left breast in female, estrogen receptor positive ([place])   2. History of gastric bypass   3. Use of anastrozole (Arimidex)     Cancer Staging  Malignant neoplasm of overlapping sites of left breast in female, estrogen receptor positive ([place]) Staging form: Breast, AJCC 8th Edition - Clinical stage from 12/21/2020: Stage Unknown (cTX, cN0, cM0, G2, ER+, PR+, HER2-) - Signed by Earlie Server, MD on 12/22/2020  # Invasive lobular left breast cancer, ER/PR +, HER2 - [id number]-  Oncotype DX recurrence score 17, -status post bilateral oncoplastic reduction-adjuvant radiation. Tolerates Arimidex 79m daily.  Recommend annual diagnostic mammogram, due in Feb  2023.  Consider MRI breast in 6 months.   01/09/2021, DEXA showed normal bone density.  Continue calcium and vitamin D limitation.   Gastric bypass surgery, recommend bariatric vitamins, check B12 and folate at next visit.   All questions were answered to patient's satisfaction.   RTC  6 months   ZTalbert Cage  Tasia Catchings, MD, PhD 09/10/2021

## 2021-09-10 NOTE — Progress Notes (Signed)
Pt here for follow up. Reports that she had bariatric surgery approx 3 months ago.

## 2021-09-23 ENCOUNTER — Ambulatory Visit: Payer: BC Managed Care – PPO | Admitting: Radiation Oncology

## 2021-10-08 ENCOUNTER — Ambulatory Visit: Payer: BC Managed Care – PPO | Admitting: Oncology

## 2021-10-08 ENCOUNTER — Other Ambulatory Visit: Payer: BC Managed Care – PPO

## 2021-10-15 ENCOUNTER — Ambulatory Visit
Admission: RE | Admit: 2021-10-15 | Discharge: 2021-10-15 | Disposition: A | Discharge status: Home / Self Care | Payer: BC Managed Care – PPO | Source: Ambulatory Visit | Attending: Oncology | Admitting: Oncology

## 2021-10-15 ENCOUNTER — Other Ambulatory Visit: Payer: Self-pay

## 2021-10-15 DIAGNOSIS — C50812 Malignant neoplasm of overlapping sites of left female breast: Secondary | ICD-10-CM | POA: Insufficient documentation

## 2021-10-15 DIAGNOSIS — Z170 Estrogen receptor positive status [ER+]: Secondary | ICD-10-CM | POA: Insufficient documentation

## 2021-10-16 ENCOUNTER — Other Ambulatory Visit: Payer: Self-pay | Admitting: Oncology

## 2021-10-16 DIAGNOSIS — C50212 Malignant neoplasm of upper-inner quadrant of left female breast: Secondary | ICD-10-CM

## 2021-10-18 ENCOUNTER — Ambulatory Visit
Admission: RE | Admit: 2021-10-18 | Discharge: 2021-10-18 | Disposition: A | Discharge status: Home / Self Care | Payer: BC Managed Care – PPO | Source: Ambulatory Visit | Attending: Oncology | Admitting: Oncology

## 2021-10-18 ENCOUNTER — Other Ambulatory Visit: Payer: Self-pay

## 2021-10-18 DIAGNOSIS — C50212 Malignant neoplasm of upper-inner quadrant of left female breast: Secondary | ICD-10-CM | POA: Diagnosis present

## 2021-10-18 HISTORY — DX: Personal history of irradiation: Z92.3

## 2021-11-30 IMAGING — MR MR BREAST BX W LOC DEV 1ST LESION IMAGE BX SPEC MR GUIDE*L*
8 of 12 series · 30 of 48 positions shown · IV contrast (10 ml gadavist)
Comparison: Previous exams.
COMPARISON: Previous exams.

Addendum:
CLINICAL DATA: Patient with recent diagnosis of left breast
invasive lobular carcinoma.

EXAM:
MRI GUIDED CORE NEEDLE BIOPSY OF THE LEFT BREAST
TECHNIQUE: Multiplanar, multisequence MR imaging of the left breast was
performed both before and after administration of intravenous
contrast.
CONTRAST:  10 mL Gadavist

[Series 2: fiducial unilateral · sagittal · 2.0mm · 1.33mm/px · 3 of 52 slices shown]
[im 1/52]
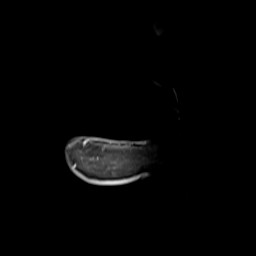
[im 26/52]
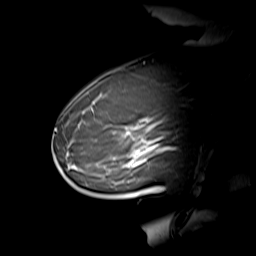
[im 52/52]
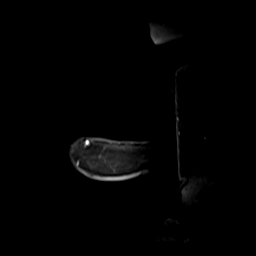

[Series 3: dynamic pre · axial · non-contrast · 1.3mm · 0.73mm/px · z∈[-93,+92]mm · 5 of 144 slices shown]
[im 1/144]
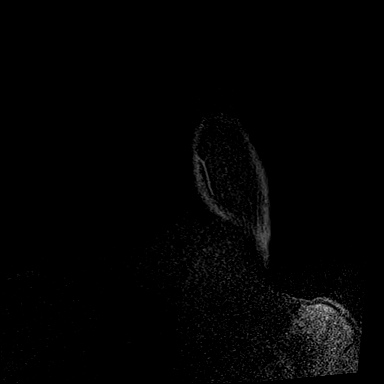
[im 36/144]
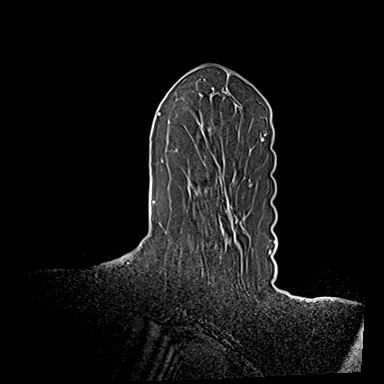
[im 72/144]
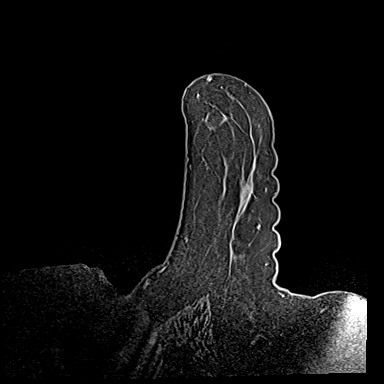
[im 108/144]
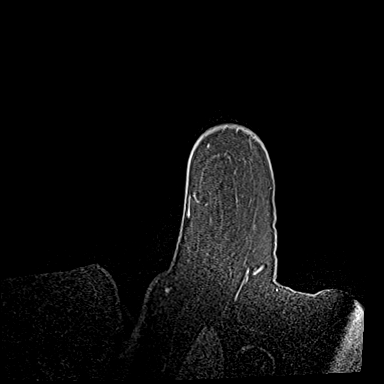
[im 144/144]
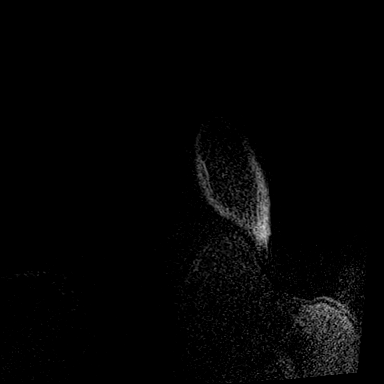

[Series 4: dynamic post 20 · axial · 1.3mm · 0.73mm/px · z∈[-93,+92]mm · 4 of 144 slices shown (1 of 2)]
[im 1/144]
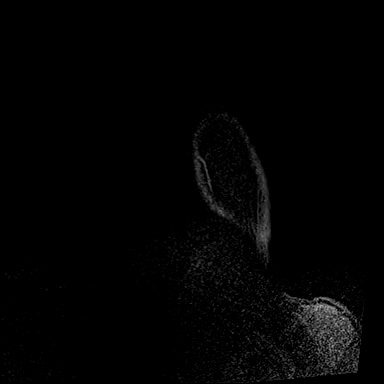
[im 48/144]
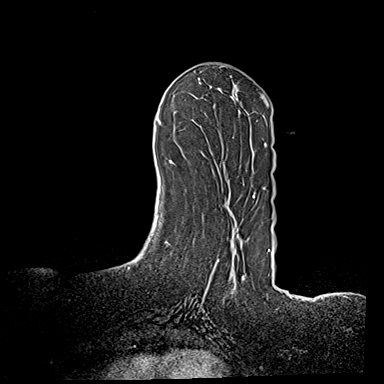
[im 96/144]
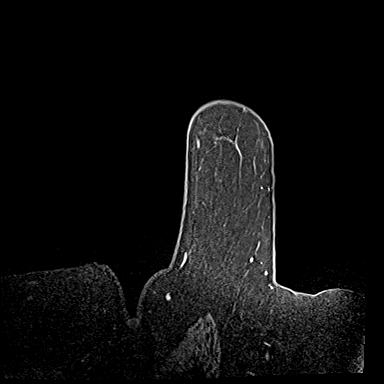
[im 144/144]
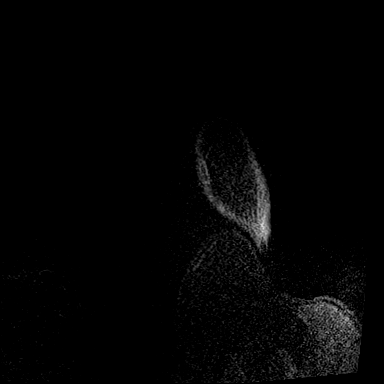

[Series 5: dynamic post 20 · axial · 1.3mm · 0.73mm/px · z∈[-93,+92]mm · 4 of 144 slices shown (2 of 2)]
[im 1/144]
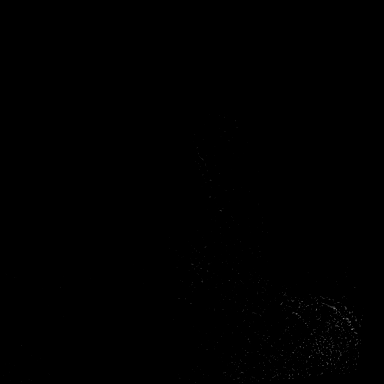
[im 48/144]
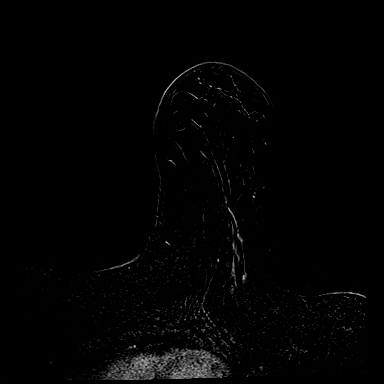
[im 96/144]
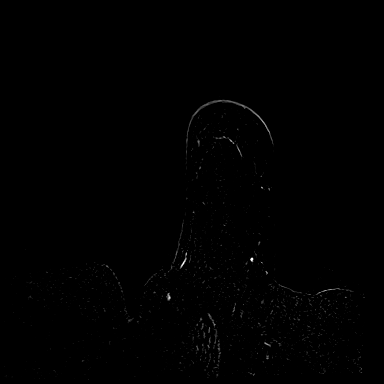
[im 144/144]
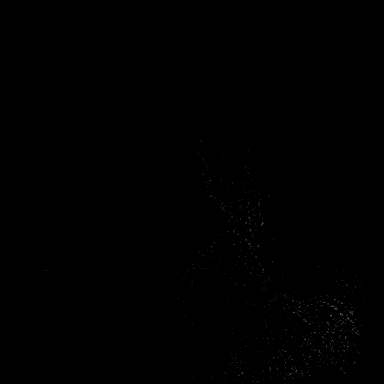

[Series 6: dynamic post 3 · axial · 1.3mm · 0.73mm/px · z∈[-93,+92]mm · 4 of 144 slices shown (1 of 2)]
[im 1/144]
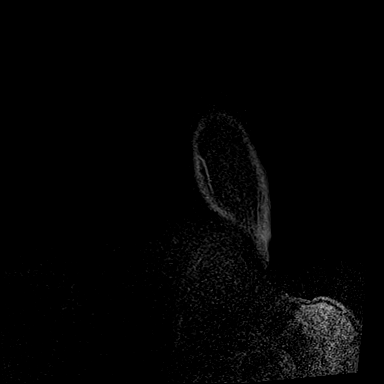
[im 48/144]
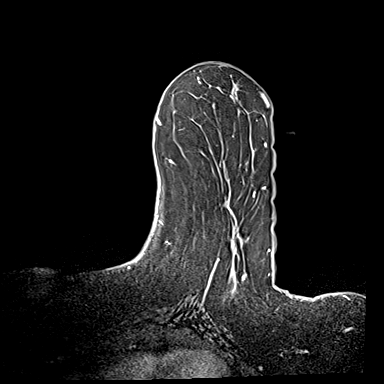
[im 96/144]
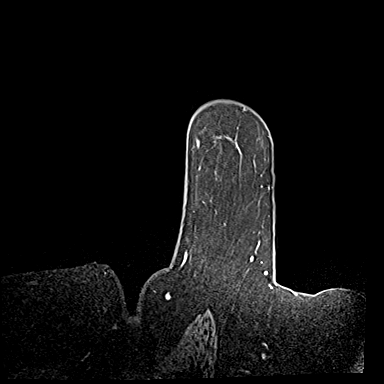
[im 144/144]
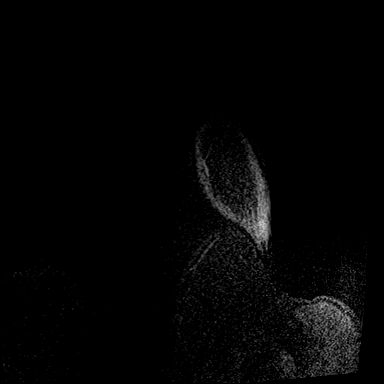

[Series 7: dynamic post 3 · axial · 1.3mm · 0.73mm/px · z∈[-93,+92]mm · 4 of 144 slices shown (2 of 2)]
[im 1/144]
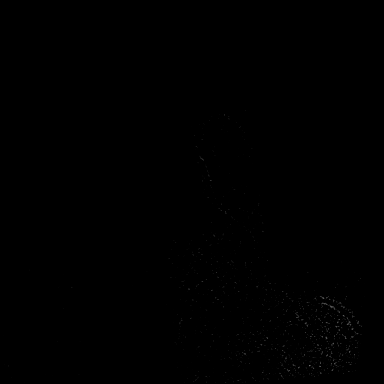
[im 48/144]
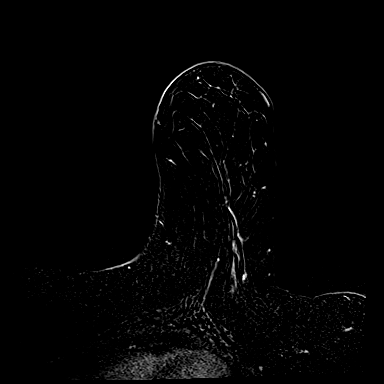
[im 96/144]
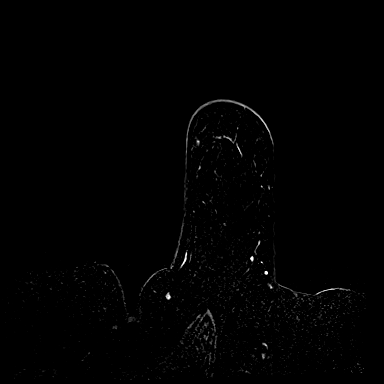
[im 144/144]
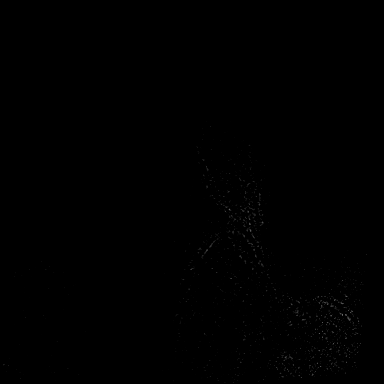

[Series 8: dynamic post 5min · axial · 1.3mm · 0.73mm/px · z∈[-93,+92]mm · 4 of 144 slices shown]
[im 1/144]
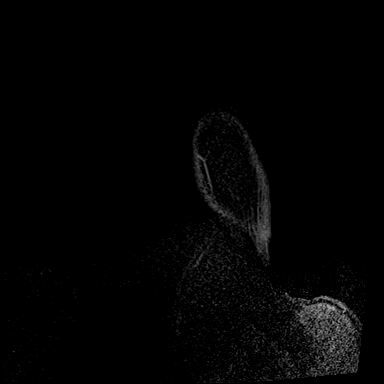
[im 48/144]
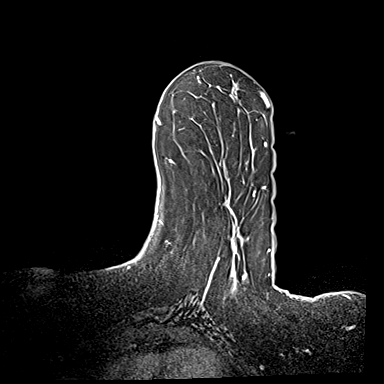
[im 96/144]
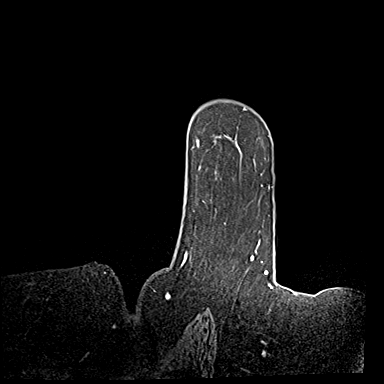
[im 144/144]
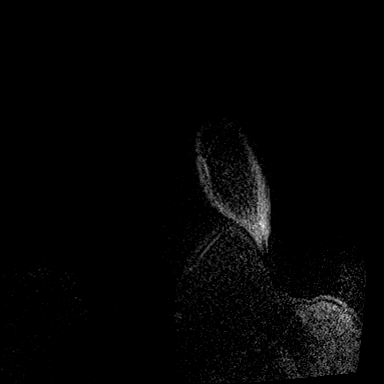

[Series 9: dynamic post 5min_sub · axial · 1.3mm · 0.73mm/px · z∈[-93,-32]mm · 2 of 144 slices shown]
[im 1/144]
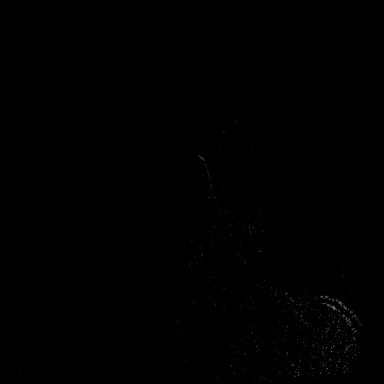
[im 48/144]
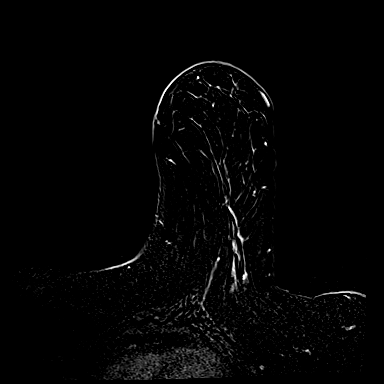

[30 of 48 positions shown; findings below may reference images not displayed]

FINDINGS: I met with the patient, and we discussed the procedure of MRI guided
biopsy, including risks, benefits, and alternatives. Specifically,
we discussed the risks of infection, bleeding, tissue injury, clip
migration, and inadequate sampling. Informed, written consent was
given. The usual time out protocol was performed immediately prior
to the procedure.

Site 1: Upper inner: Dumbbell clip

Using sterile technique, 1% Lidocaine, MRI guidance, and a 9 gauge
vacuum assisted device, biopsy was performed of small mass upper
inner left breast using a lateral approach. At the conclusion of the
procedure, a dumbbell tissue marker clip was deployed into the
biopsy cavity. Follow-up 2-view mammogram was performed and dictated
separately.

Site 2: Lateral mid: Cylinder clip

Using sterile technique, 1% Lidocaine, MRI guidance, and a 9 gauge
vacuum assisted device, biopsy was performed of non mass enhancement
outer left breast using a lateral approach. At the conclusion of the
procedure, a cylinder shaped tissue marker clip was deployed into
the biopsy cavity. Follow-up 2-view mammogram was performed and
dictated separately.

Site 3: Lower outer: Dumbbell clip

Using sterile technique, 1% Lidocaine, MRI guidance, and a 9 gauge
vacuum assisted device, biopsy was performed of non mass enhancement
lower outer left breast using a lateral approach. At the conclusion
of the procedure, a dumbbell-shaped tissue marker clip was deployed
into the biopsy cavity. Follow-up 2-view mammogram was performed and
dictated separately.
IMPRESSION: MRI guided biopsy of left breast 3 sites as above. No apparent
complications.

ADDENDUM:
Pathology revealed FOCAL FAT NECROSIS WITH ASSOCIATED INFLAMMATION
of the LEFT breast, upper inner. This was found to be discordant by
Dr. River Koirala, with excision recommended.

Pathology revealed GRADE II INVASIVE LOBULAR CARCINOMA, DUCTAL
CARCINOMA IN SITU, LOBULAR NEOPLASIA (ATYPICAL LOBULAR HYPERPLASIA)
of the LEFT breast, lateral, mid. This was found to be concordant by
Dr. River Koirala.

Pathology revealed FOCUS SUSPICIOUS FOR PERINEURAL INVASION, LOBULAR
NEOPLASIA (ATYPICAL LOBULAR HYPERPLASIA), PREVIOUS BIOPSY SITE
CHANGES of the LEFT breast, lower outer. This was found to be
concordant by Dr. River Koirala.

Pathology results were discussed with the patient by telephone. The
patient reported doing well after the biopsies with tenderness at
the sites. Post biopsy instructions and care were reviewed and
questions were answered. The patient was encouraged to call The
[REDACTED] or [HOSPITAL] with
[HOSPITAL] for any additional concerns. My direct phone
number was provided.

The patient has a recent diagnosis of LEFT breast cancer and should
follow her outlined treatment plan. The patient is scheduled to see
Dr. Dianel Theran on December 06, 2020 at [HOSPITAL] [REDACTED].

Dr. Brizz, and Rajie Pyper, RN Oncology Navigator with
[HOSPITAL] [HOSPITAL], were notified of pathology results
and recommendations via [REDACTED] message on December 03, 2020.

Pathology results reported by Lukyy Alomar, RN on 12/03/2020.

*** End of Addendum ***
FINDINGS: I met with the patient, and we discussed the procedure of MRI guided
biopsy, including risks, benefits, and alternatives. Specifically,
we discussed the risks of infection, bleeding, tissue injury, clip
migration, and inadequate sampling. Informed, written consent was
given. The usual time out protocol was performed immediately prior
to the procedure.

Site 1: Upper inner: Dumbbell clip

Using sterile technique, 1% Lidocaine, MRI guidance, and a 9 gauge
vacuum assisted device, biopsy was performed of small mass upper
inner left breast using a lateral approach. At the conclusion of the
procedure, a dumbbell tissue marker clip was deployed into the
biopsy cavity. Follow-up 2-view mammogram was performed and dictated
separately.

Site 2: Lateral mid: Cylinder clip

Using sterile technique, 1% Lidocaine, MRI guidance, and a 9 gauge
vacuum assisted device, biopsy was performed of non mass enhancement
outer left breast using a lateral approach. At the conclusion of the
procedure, a cylinder shaped tissue marker clip was deployed into
the biopsy cavity. Follow-up 2-view mammogram was performed and
dictated separately.

Site 3: Lower outer: Dumbbell clip

Using sterile technique, 1% Lidocaine, MRI guidance, and a 9 gauge
vacuum assisted device, biopsy was performed of non mass enhancement
lower outer left breast using a lateral approach. At the conclusion
of the procedure, a dumbbell-shaped tissue marker clip was deployed
into the biopsy cavity. Follow-up 2-view mammogram was performed and
dictated separately.
IMPRESSION: MRI guided biopsy of left breast 3 sites as above. No apparent
complications.

## 2022-03-10 ENCOUNTER — Inpatient Hospital Stay: Payer: BC Managed Care – PPO | Attending: Oncology

## 2022-03-10 DIAGNOSIS — Z9049 Acquired absence of other specified parts of digestive tract: Secondary | ICD-10-CM | POA: Insufficient documentation

## 2022-03-10 DIAGNOSIS — M2550 Pain in unspecified joint: Secondary | ICD-10-CM | POA: Diagnosis not present

## 2022-03-10 DIAGNOSIS — Z803 Family history of malignant neoplasm of breast: Secondary | ICD-10-CM | POA: Insufficient documentation

## 2022-03-10 DIAGNOSIS — Z801 Family history of malignant neoplasm of trachea, bronchus and lung: Secondary | ICD-10-CM | POA: Insufficient documentation

## 2022-03-10 DIAGNOSIS — Z170 Estrogen receptor positive status [ER+]: Secondary | ICD-10-CM | POA: Diagnosis not present

## 2022-03-10 DIAGNOSIS — Z79811 Long term (current) use of aromatase inhibitors: Secondary | ICD-10-CM | POA: Insufficient documentation

## 2022-03-10 DIAGNOSIS — Z923 Personal history of irradiation: Secondary | ICD-10-CM | POA: Diagnosis not present

## 2022-03-10 DIAGNOSIS — N6092 Unspecified benign mammary dysplasia of left breast: Secondary | ICD-10-CM | POA: Diagnosis not present

## 2022-03-10 DIAGNOSIS — Z800 Family history of malignant neoplasm of digestive organs: Secondary | ICD-10-CM | POA: Insufficient documentation

## 2022-03-10 DIAGNOSIS — Z79899 Other long term (current) drug therapy: Secondary | ICD-10-CM | POA: Insufficient documentation

## 2022-03-10 DIAGNOSIS — N6489 Other specified disorders of breast: Secondary | ICD-10-CM | POA: Insufficient documentation

## 2022-03-10 DIAGNOSIS — C50812 Malignant neoplasm of overlapping sites of left female breast: Secondary | ICD-10-CM | POA: Diagnosis present

## 2022-03-10 DIAGNOSIS — Z8249 Family history of ischemic heart disease and other diseases of the circulatory system: Secondary | ICD-10-CM | POA: Insufficient documentation

## 2022-03-10 DIAGNOSIS — I10 Essential (primary) hypertension: Secondary | ICD-10-CM | POA: Diagnosis not present

## 2022-03-10 DIAGNOSIS — Z9884 Bariatric surgery status: Secondary | ICD-10-CM | POA: Insufficient documentation

## 2022-03-10 LAB — CBC WITH DIFFERENTIAL/PLATELET
Abs Immature Granulocytes: 0.01 10*3/uL (ref 0.00–0.07)
Basophils Absolute: 0 10*3/uL (ref 0.0–0.1)
Basophils Relative: 1 %
Eosinophils Absolute: 0.6 10*3/uL — ABNORMAL HIGH (ref 0.0–0.5)
Eosinophils Relative: 12 %
HCT: 38.5 % (ref 36.0–46.0)
Hemoglobin: 12.7 g/dL (ref 12.0–15.0)
Immature Granulocytes: 0 %
Lymphocytes Relative: 26 %
Lymphs Abs: 1.3 10*3/uL (ref 0.7–4.0)
MCH: 31.1 pg (ref 26.0–34.0)
MCHC: 33 g/dL (ref 30.0–36.0)
MCV: 94.1 fL (ref 80.0–100.0)
Monocytes Absolute: 0.5 10*3/uL (ref 0.1–1.0)
Monocytes Relative: 10 %
Neutro Abs: 2.5 10*3/uL (ref 1.7–7.7)
Neutrophils Relative %: 51 %
Platelets: 198 10*3/uL (ref 150–400)
RBC: 4.09 MIL/uL (ref 3.87–5.11)
RDW: 13.4 % (ref 11.5–15.5)
WBC: 5 10*3/uL (ref 4.0–10.5)
nRBC: 0 % (ref 0.0–0.2)

## 2022-03-10 LAB — COMPREHENSIVE METABOLIC PANEL
ALT: 36 U/L (ref 0–44)
AST: 39 U/L (ref 15–41)
Albumin: 4 g/dL (ref 3.5–5.0)
Alkaline Phosphatase: 77 U/L (ref 38–126)
Anion gap: 4 — ABNORMAL LOW (ref 5–15)
BUN: 20 mg/dL (ref 8–23)
CO2: 28 mmol/L (ref 22–32)
Calcium: 9.8 mg/dL (ref 8.9–10.3)
Chloride: 109 mmol/L (ref 98–111)
Creatinine, Ser: 0.87 mg/dL (ref 0.44–1.00)
GFR, Estimated: >60 mL/min (ref >60)
Glucose, Bld: 83 mg/dL (ref 70–99)
Potassium: 3.8 mmol/L (ref 3.5–5.1)
Sodium: 141 mmol/L (ref 135–145)
Total Bilirubin: 0.5 mg/dL (ref 0.3–1.2)
Total Protein: 7.1 g/dL (ref 6.5–8.1)

## 2022-03-10 LAB — VITAMIN B12: Vitamin B-12: 1611 pg/mL — ABNORMAL HIGH (ref 180–914)

## 2022-03-10 LAB — FOLATE: Folate: 38 ng/mL (ref >5.9)

## 2022-03-12 ENCOUNTER — Encounter: Payer: Self-pay | Admitting: Oncology

## 2022-03-12 ENCOUNTER — Inpatient Hospital Stay: Payer: BC Managed Care – PPO | Admitting: Oncology

## 2022-03-12 VITALS — BP 126/70 | HR 71 | Temp 96.5°F | Resp 16 | Ht 64.0 in | Wt 179.0 lb

## 2022-03-12 DIAGNOSIS — Z79811 Long term (current) use of aromatase inhibitors: Secondary | ICD-10-CM

## 2022-03-12 DIAGNOSIS — Z9884 Bariatric surgery status: Secondary | ICD-10-CM

## 2022-03-12 DIAGNOSIS — C50919 Malignant neoplasm of unspecified site of unspecified female breast: Secondary | ICD-10-CM | POA: Diagnosis not present

## 2022-03-12 DIAGNOSIS — C50812 Malignant neoplasm of overlapping sites of left female breast: Secondary | ICD-10-CM | POA: Diagnosis not present

## 2022-03-12 MED ORDER — ANASTROZOLE 1 MG PO TABS: 1.0000 mg | ORAL_TABLET | Freq: Every day | ORAL | 5 refills | Status: DC

## 2022-03-12 NOTE — Progress Notes (Signed)
Hematology/Oncology Progress note [phone] [phone]      Clinic Day:  03/12/2022  Referring physician: Ulyess Blossom, PA  Chief Complaint: Holly Henderson is a 66 y.o. female presents for follow-up of left breast lobular carcinoma.  PERTINENT ONCOLOGY HISTORY Holly Henderson is a 66 y.o.afemale who has above oncology history reviewed by me today presented for follow up visit for management of left breast lobular carcinoma  Patient previously followed up by Dr.Corcoran, patient switched care to me on 12/22/20 Extensive medical records review was performed by me 10/11/2020 Bilateral screening mammogram on  revealed asymmetry with distortion in the left breast.   10/29/2020 Left diagnostic mammogram  revealed focal asymmetry in the upper-outer quadrant of the LEFT breast, with possible subtle associated architectural distortion.  11/05/2020, left upper outer quadrant breast mass biopsy showed grade 1 invasive lobular carcinoma, ER 90%, PR 90%, HER2 negative IHC  11/16/2020 bilateral breast MRI revealed suspicious findings in the left breast. Recommended 3 site biopsy in the left breast: 1) 0.5 cm mass medial upper left breast. 2) 1 cm area of focal enhancement posterior lateral lower left breast. 3) Most posterior aspect of enhancement in the previously biopsy tumor area to assess posterior extent of disease.  11/29/2020 patient underwent additional biopsy of the left breast Lateral mid left breast needle biopsy  revealed invasive lobular carcinoma with DCIS and lobular neoplasia- (atypical lobular hyperplasia) Lower outer left breast needle biopsy revealed a focus suspicious for perineural invasion and lobular neoplasia (atypical lobular hyperplasia).  Upper inner left breast biopsy revealed focal fat necrosis with associated inflammation. Tumor cells were ER + (85%), PR + (95%), HER2 - (1+). Ki67 was 2%.  Patient's case was discussed on breast  tumor board 12/17/2020. Area of concern spans 11-12 centimeters in all sites will need to be resected.  Patient has established care with Dr. Lysle Pearl and Dr. Marla Roe. Patient has decided on bilateral oncoplastic breast reduction.  # 01/02/2021, patient underwent bilateral oncoplastic breast reduction Pathology showed DIAGNOSIS:  A. BREAST, LEFT; EXCISION: - BENIGN MAMMARY PARENCHYMA WITH FOCAL LOBULAR NEOPLASIA.  - CLIPS X2 (DUMBBELL AND CYLINDER), RF TAGS X2, AND BIOPSY SITES IDENTIFIED.  - NO EVIDENCE OF RESIDUAL INVASIVE CARCINOMA.  B. BREAST, LEFT LATERAL; EXCISION:  - INVASIVE LOBULAR CARCINOMA. - SINGLE RF TAG IDENTIFIED; NO BIOPSY CLIPS PRESENT. - BACKGROUND LOBULAR NEOPLASIA.  C. BREAST, LEFT INFERIOR/POSTERIOR; EXCISION:  - LOBULAR NEOPLASIA. - CLIPS X2 (DUMBBELL AND X), RF TAG X1, AND BIOPSY SITES IDENTIFIED. - NO EVIDENCE OF RESIDUAL INVASIVE CARCINOMA. - SEE CANCER SUMMARY.  D. LYMPH NODE, LEFT SENTINEL #1; EXCISION:  - TWO LYMPH NODES, NEGATIVE FOR MALIGNANCY (0/2).  E. BREAST, RIGHT; MASTOPEXY:  - BENIGN SKIN AND MAMMARY PARENCHYMA. - NEGATIVE FOR ATYPICAL PROLIFERATIVE BREAST DISEASE.  F. BREAST, LEFT; MASTOPEXY:  - BENIGN SKIN AND SUBCUTANEOUS TISSUE. - NEGATIVE FOR MALIGNANCY.    # Genetic testing negative. #Patient received adjuvant radiation and was started on adjuvant endocrine therapy with Arimidex after her 04/02/2021 visits. 05/16/21 Gastric bypass surgery.   INTERVAL HISTORY Holly Henderson is a 66 y.o. female who has above history reviewed by me today presents for follow up visit for left breast lobular carcinoma Patient has been on Arimidex since August 2022.  She reports feeling well She has no new complaints today.     Review of Systems  Constitutional:  Negative for chills and fever.  HENT:   Negative for sore throat.   Eyes:  Negative for eye problems.  Respiratory:  Negative for cough, shortness of breath and wheezing.   Cardiovascular:  Negative  for chest pain and palpitations.  Gastrointestinal:  Negative for abdominal pain, blood in stool, nausea and vomiting.  Endocrine: Negative for hot flashes.  Genitourinary:  Negative for dysuria.   Musculoskeletal:  Positive for arthralgias. Negative for myalgias.  Skin:  Negative for rash.  Neurological:  Negative for dizziness.  Hematological:  Does not bruise/bleed easily.     Past Medical History:  Diagnosis Date   Diabetes mellitus without complication ([place])    Family history of breast cancer    Family history of colon cancer    Family history of glioblastoma    Hypertension    Malignant neoplasm of upper-outer quadrant of left female breast ([place]) 11/13/2020   Personal history of radiation therapy     Past Surgical History:  Procedure Laterality Date   BREAST BIOPSY Left 11/05/2020   stereo bx, x-clip, positive   BREAST LUMPECTOMY Left 01/02/2021   BREAST REDUCTION SURGERY Bilateral 01/02/2021   Procedure: BILATERAL ONCOPLASTIC BREAST REDUCTION;  Surgeon: Wallace Going, DO;  Location: [facility];  Service: Plastics;  Laterality: Bilateral;   CHOLECYSTECTOMY     COLONOSCOPY     dental work     FRACTURE SURGERY     left - right arm - left arm   PART MASTECTOMY,RADIO FREQUENCY LOCALIZER,AXILLARY SENTINEL NODE BIOPSY Left 01/02/2021   Procedure: PART MASTECTOMY,RADIO FREQUENCY LOCALIZER,AXILLARY SENTINEL NODE BIOPSY;  Surgeon: Benjamine Sprague, DO;  Location: [facility];  Service: General;  Laterality: Left;   TOTAL KNEE ARTHROPLASTY Left 01/26/2020   Procedure: TOTAL KNEE ARTHROPLASTY;  Surgeon: Corky Mull, MD;  Location: [facility];  Service: Orthopedics;  Laterality: Left;    Family History  Problem Relation Age of Onset   Breast cancer Mother 51   Cancer - Other Father        glioblastoma x2, d. 4   Lung cancer Maternal Grandmother        hx smoking   AAA (abdominal aortic aneurysm) Maternal Grandfather    Colon cancer Paternal Grandfather        dx 26s, d. 42s    Breast cancer Other     Social History:  reports that she has never smoked. She has never used smokeless tobacco. She reports current alcohol use. She reports that she does not use drugs. She has never smoked. She has not had any alcohol since she was diagnosed with diabetes. She denies exposure to radiation or toxins. She has 6 children. She previously lived in [place]. 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 The patient is alone today.  Allergies:  Allergies  Allergen Reactions   Latex Other (See Comments)    Irritation/gyn    Current Medications: Current Outpatient Medications  Medication Sig Dispense Refill   hydrochlorothiazide (HYDRODIURIL) 25 MG tablet Take 1 tablet by mouth daily.     losartan (COZAAR) 50 MG tablet Take 50 mg by mouth daily.     losartan (COZAAR) 50 MG tablet Take 1 tablet by mouth daily.     Multiple Vitamins-Minerals (MULTIVITAMIN WITH MINERALS) tablet Take 1 tablet by mouth daily.     VITAMIN D PO Take by mouth.     anastrozole (ARIMIDEX) 1 MG tablet Take 1 tablet (1 mg total) by mouth daily. 30 tablet 5   No current facility-administered medications for this visit.     Performance status (ECOG): 1  Vitals Blood pressure 126/70, pulse 71, temperature (!) 96.5  F (35.8 C), temperature source Tympanic, resp. rate 16, height _0  (1.626 m), weight 179 lb (81.2 kg), SpO2 100 %.   Physical Exam Vitals and nursing note reviewed.  Constitutional:      General: She is not in acute distress.    Appearance: She is not diaphoretic.  Eyes:     General: No scleral icterus.    Conjunctiva/sclera: Conjunctivae normal.  Cardiovascular:     Rate and Rhythm: Normal rate and regular rhythm.     Pulses: Normal pulses.  Pulmonary:     Effort: Pulmonary effort is normal.     Breath sounds: Normal breath sounds.  Abdominal:     General: There is no distension.  Musculoskeletal:        General: No swelling.     Cervical back: Normal range of motion.   Skin:    General: Skin is warm and dry.  Neurological:     General: No focal deficit present.     Mental Status: She is alert and oriented to person, place, and time.  Psychiatric:        Mood and Affect: Mood normal.      Laboratory findings CBC    Component Value Date/Time   WBC 5.0 03/10/2022 1336   RBC 4.09 03/10/2022 1336   HGB 12.7 03/10/2022 1336   HCT 38.5 03/10/2022 1336   PLT 198 03/10/2022 1336   MCV 94.1 03/10/2022 1336   MCH 31.1 03/10/2022 1336   MCHC 33.0 03/10/2022 1336   RDW 13.4 03/10/2022 1336   LYMPHSABS 1.3 03/10/2022 1336   MONOABS 0.5 03/10/2022 1336   EOSABS 0.6 (H) 03/10/2022 1336   BASOSABS 0.0 03/10/2022 1336      Latest Ref Rng & Units 03/10/2022    1:36 PM 09/10/2021    1:00 PM 11/13/2020   12:08 PM  CMP  Glucose 70 - 99 mg/dL 83  109  120   BUN 8 - 23 mg/dL _1 Creatinine 0.44 - 1.00 mg/dL 0.87  0.65  0.64   Sodium 135 - 145 mmol/L 141  141  138   Potassium 3.5 - 5.1 mmol/L 3.8  3.6  4.1   Chloride 98 - 111 mmol/L 109  108  107   CO2 22 - 32 mmol/L _2 Calcium 8.9 - 10.3 mg/dL 9.8  9.6  9.3   Total Protein 6.5 - 8.1 g/dL 7.1  7.1  7.3   Total Bilirubin 0.3 - 1.2 mg/dL 0.5  0.6  0.9   Alkaline Phos 38 - 126 U/L 77  75  66   AST 15 - 41 U/L 39  29  24   ALT 0 - 44 U/L 36  27  32      Preoperation CA 27-29 is normal 28.6  Assessment and Plan. 1. Malignant neoplasm of female breast, unspecified estrogen receptor status, unspecified laterality, unspecified site of breast ([place])   2. Use of anastrozole (Arimidex)   3. History of gastric bypass     Cancer Staging  Malignant neoplasm of overlapping sites of left breast in female, estrogen receptor positive ([place]) Staging form: Breast, AJCC 8th Edition - Clinical stage from 12/21/2020: Stage Unknown (cTX, cN0, cM0, G2, ER+, PR+, HER2-) - Signed by Earlie Server, MD on 12/22/2020  # Invasive lobular left breast cancer, ER/PR +, HER2 - mpT1bN0-  Oncotype DX recurrence score  17, -status post bilateral oncoplastic reduction-adjuvant radiation. Continue Arimidex 37m daily.  Recommend MRI breast given lobular cytology Recommend annual diagnostic mammogram, due in Feb 2024.  .   01/09/2021, DEXA showed normal bone density.  Continue calcium and vitamin D limitation.   Gastric bypass surgery, recommend bariatric vitamins, he has adequate B12 and folate a  All questions were answered to patient's satisfaction.   RTC  6 months   Earlie Server, MD, PhD 03/12/2022

## 2022-03-18 ENCOUNTER — Telehealth: Payer: Self-pay

## 2022-03-18 NOTE — Telephone Encounter (Signed)
Patient calling about her MRI, states she still has not heard anything Callback # [phone]

## 2022-03-18 NOTE — Telephone Encounter (Signed)
Hi ladies, have you heard anything regarding MRI?

## 2022-03-19 NOTE — Telephone Encounter (Signed)
Per Roselyn Reef @ [facility], pt is on her list to schedule.

## 2022-04-08 ENCOUNTER — Encounter: Payer: Self-pay | Admitting: Plastic Surgery

## 2022-04-08 ENCOUNTER — Ambulatory Visit: Payer: BC Managed Care – PPO | Admitting: Plastic Surgery

## 2022-04-08 DIAGNOSIS — C50812 Malignant neoplasm of overlapping sites of left female breast: Secondary | ICD-10-CM

## 2022-04-08 DIAGNOSIS — M793 Panniculitis, unspecified: Secondary | ICD-10-CM | POA: Diagnosis not present

## 2022-04-08 DIAGNOSIS — Z170 Estrogen receptor positive status [ER+]: Secondary | ICD-10-CM | POA: Diagnosis not present

## 2022-04-08 DIAGNOSIS — R21 Rash and other nonspecific skin eruption: Secondary | ICD-10-CM

## 2022-04-08 DIAGNOSIS — Z9884 Bariatric surgery status: Secondary | ICD-10-CM

## 2022-04-08 NOTE — Progress Notes (Signed)
Patient ID: Holly Henderson, female    DOB: 10/19/1955, 66 y.o.   MRN: [id number]   Chief Complaint  Patient presents with   Advice Only   Skin Problem    The patient is a 66 year old female here for evaluation of her abdomen.  She had breast cancer and underwent bilateral oncoplastic breast reductions at the time of her breast cancer treatment in May 2021. The patient underwent bariatric surgery in Sep 2022.  She lost 120 pounds in the past year.  Her weight has been stable over the past 3 months.  Her previous surgeries include breast surgery and a cholecystectomy.  She is 5 feet 4 inches tall and weighs 177 pounds.  She is interested in a panniculectomy, brachioplasty and thigh lift.  She does not have a palpable hernia.    Review of Systems  Constitutional:  Positive for activity change. Negative for appetite change.  Eyes: Negative.   Respiratory: Negative.    Cardiovascular: Negative.   Gastrointestinal: Negative.   Endocrine: Negative.   Genitourinary: Negative.   Musculoskeletal:  Positive for back pain.  Skin:  Positive for rash.  Hematological: Negative.   Psychiatric/Behavioral: Negative.      Past Medical History:  Diagnosis Date   Diabetes mellitus without complication ([place])    Family history of breast cancer    Family history of colon cancer    Family history of glioblastoma    Hypertension    Malignant neoplasm of upper-outer quadrant of left female breast ([place]) 11/13/2020   Personal history of radiation therapy     Past Surgical History:  Procedure Laterality Date   BREAST BIOPSY Left 11/05/2020   stereo bx, x-clip, positive   BREAST LUMPECTOMY Left 01/02/2021   BREAST REDUCTION SURGERY Bilateral 01/02/2021   Procedure: BILATERAL ONCOPLASTIC BREAST REDUCTION;  Surgeon: Wallace Going, DO;  Location: [facility];  Service: Plastics;  Laterality: Bilateral;   CHOLECYSTECTOMY     COLONOSCOPY     dental work     FRACTURE SURGERY     left -  right arm - left arm   PART MASTECTOMY,RADIO FREQUENCY LOCALIZER,AXILLARY SENTINEL NODE BIOPSY Left 01/02/2021   Procedure: PART MASTECTOMY,RADIO FREQUENCY LOCALIZER,AXILLARY SENTINEL NODE BIOPSY;  Surgeon: Benjamine Sprague, DO;  Location: [facility];  Service: General;  Laterality: Left;   TOTAL KNEE ARTHROPLASTY Left 01/26/2020   Procedure: TOTAL KNEE ARTHROPLASTY;  Surgeon: Corky Mull, MD;  Location: [facility];  Service: Orthopedics;  Laterality: Left;      Current Outpatient Medications:    anastrozole (ARIMIDEX) 1 MG tablet, Take 1 tablet (1 mg total) by mouth daily., Disp: 30 tablet, Rfl: 5   hydrochlorothiazide (HYDRODIURIL) 25 MG tablet, Take 1 tablet by mouth daily., Disp: , Rfl:    losartan (COZAAR) 50 MG tablet, Take 1 tablet by mouth daily., Disp: , Rfl:    Multiple Vitamins-Minerals (MULTIVITAMIN WITH MINERALS) tablet, Take 1 tablet by mouth daily., Disp: , Rfl:    Objective:   Vitals:   04/08/22 1408  BP: (!) 145/77  Pulse: 64  SpO2: 100%    Physical Exam Vitals reviewed.  Constitutional:      Appearance: Normal appearance.  HENT:     Head: Normocephalic and atraumatic.  Cardiovascular:     Rate and Rhythm: Normal rate.     Pulses: Normal pulses.  Pulmonary:     Effort: Pulmonary effort is normal.  Abdominal:     General: There is no distension.     Palpations:  Abdomen is soft. There is no mass.     Tenderness: There is no abdominal tenderness. There is no guarding.     Hernia: No hernia is present.  Musculoskeletal:        General: No swelling or deformity.  Skin:    General: Skin is warm.     Capillary Refill: Capillary refill takes less than 2 seconds.     Coloration: Skin is not jaundiced.     Findings: No bruising.  Neurological:     Mental Status: She is alert and oriented to person, place, and time.  Psychiatric:        Mood and Affect: Mood normal.        Behavior: Behavior normal.        Thought Content: Thought content normal.        Judgment:  Judgment normal.     Assessment & Plan:  Morbid obesity ([place])  Malignant neoplasm of overlapping sites of left breast in female, estrogen receptor positive ([place])  Panniculitis  The patient is a good candidate for a panniculectomy with possible abdominoplasty.    Pictures were obtained of the patient and placed in the chart with the patient's or guardian's permission.   [place], DO

## 2022-04-18 ENCOUNTER — Ambulatory Visit
Admission: RE | Admit: 2022-04-18 | Discharge: 2022-04-18 | Disposition: A | Discharge status: Home / Self Care | Payer: BC Managed Care – PPO | Source: Ambulatory Visit | Attending: Oncology | Admitting: Oncology

## 2022-04-18 DIAGNOSIS — C50919 Malignant neoplasm of unspecified site of unspecified female breast: Secondary | ICD-10-CM | POA: Insufficient documentation

## 2022-04-18 MED ORDER — GADOBUTROL 1 MMOL/ML IV SOLN
8.0000 mL | Freq: Once | INTRAVENOUS | Status: AC | PRN
  Administered 2022-04-18: 8 mL via INTRAVENOUS

## 2022-04-30 ENCOUNTER — Telehealth: Payer: Self-pay

## 2022-04-30 NOTE — Telephone Encounter (Signed)
Patient was told to call today if she hasnt heard back from anyone in regards to insurance covering her surgery.

## 2022-05-01 ENCOUNTER — Telehealth: Payer: Self-pay | Admitting: *Deleted

## 2022-05-01 NOTE — Telephone Encounter (Signed)
LVM to let patient know I will be starting on her auth request today.

## 2022-05-01 NOTE — Telephone Encounter (Signed)
Auth submitted to [facility] for CPT [id number] Pending Auth: [id number]

## 2022-05-06 ENCOUNTER — Telehealth: Payer: Self-pay | Admitting: *Deleted

## 2022-05-06 NOTE — Telephone Encounter (Signed)
Notified patient of denial for panniculectomy. She is requesting quotes for Brachioplasty, Panniculectomy, and Panni+Abdominoplasty. Message sent to provider and Joss

## 2022-05-22 ENCOUNTER — Encounter: Payer: Self-pay | Admitting: *Deleted

## 2022-06-22 ENCOUNTER — Encounter: Payer: Self-pay | Admitting: Plastic Surgery

## 2022-08-08 ENCOUNTER — Telehealth: Payer: Self-pay | Admitting: *Deleted

## 2022-08-08 NOTE — Telephone Encounter (Signed)
Spoke with patient to schedule surgery and related appointments

## 2022-08-14 ENCOUNTER — Telehealth: Payer: Self-pay | Admitting: Surgical

## 2022-08-14 NOTE — Telephone Encounter (Signed)
Notified pt that appt on 12/29 would change providers from [facility] to [place], [place] and she was fine with the change.

## 2022-08-22 ENCOUNTER — Ambulatory Visit (INDEPENDENT_AMBULATORY_CARE_PROVIDER_SITE_OTHER): Payer: Self-pay | Admitting: Physician Assistant

## 2022-08-22 VITALS — BP 135/62 | HR 60 | Ht 64.0 in | Wt 180.0 lb

## 2022-08-22 DIAGNOSIS — M793 Panniculitis, unspecified: Secondary | ICD-10-CM

## 2022-08-22 MED ORDER — CEPHALEXIN 500 MG PO CAPS: 500.0000 mg | ORAL_CAPSULE | Freq: Four times a day (QID) | ORAL | 0 refills | Status: DC

## 2022-08-22 MED ORDER — OXYCODONE HCL 5 MG PO TABS: 5.0000 mg | ORAL_TABLET | Freq: Four times a day (QID) | ORAL | 0 refills | Status: DC | PRN

## 2022-08-22 NOTE — Progress Notes (Signed)
Patient ID: Holly Henderson, female    DOB: [birth year], 66 y.o.   MRN: [id number]  No chief complaint on file.   No diagnosis found.   History of Present Illness:  Holly Henderson is a 66 y.o.  female  with a history of breast cancer and underwent bilateral oncoplastic breast reduction in May 2021, she underwent bariatric surgery in September 2022.  She presents for preoperative evaluation for upcoming procedure, abdominoplasty, scheduled for 09/15/2022 with Dr. Marla Roe.  The patient has not had problems with anesthesia. Previous surgeries include breast surgery, cholecystectomy, bariatric surgery, knee arthroplasty   Summary of Previous Visit: The patient was last seen in the office on 04/08/2022 by Dr. Marla Roe.  At that time we discussed several surgical options including abdominoplasty.  The patient since that time has decided to move forward with abdominoplasty given that she was not a candidate her insurance for panniculectomy.   She notes she maintains a healthy lifestyle, she is not on any anticoagulants or antiplatelets.  She has had previous surgeries with no issues with anesthesia.  She denies any history of cardiac events, no history of DVT or PE.   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   Past Medical History: Allergies: Allergies  Allergen Reactions   Latex Other (See Comments)    Irritation/gyn    Current Medications:  Current Outpatient Medications:    anastrozole (ARIMIDEX) 1 MG tablet, Take 1 tablet (1 mg total) by mouth daily., Disp: 30 tablet, Rfl: 5   hydrochlorothiazide (HYDRODIURIL) 25 MG tablet, Take 1 tablet by mouth daily., Disp: , Rfl:    losartan (COZAAR) 50 MG tablet, Take 1 tablet by mouth daily., Disp: , Rfl:    Multiple Vitamins-Minerals (MULTIVITAMIN WITH  MINERALS) tablet, Take 1 tablet by mouth daily., Disp: , Rfl:   Past Medical Problems: Past Medical History:  Diagnosis Date   Diabetes mellitus without complication ([place])    Family history of breast cancer    Family history of colon cancer    Family history of glioblastoma    Hypertension    Malignant neoplasm of upper-outer quadrant of left female breast ([place]) 11/13/2020   Personal history of radiation therapy     Past Surgical History: Past Surgical History:  Procedure Laterality Date   BREAST BIOPSY Left 11/05/2020   stereo bx, x-clip, positive   BREAST LUMPECTOMY Left 01/02/2021   BREAST REDUCTION SURGERY Bilateral 01/02/2021   Procedure: BILATERAL ONCOPLASTIC BREAST REDUCTION;  Surgeon: Wallace Going, DO;  Location: [facility];  Service: Plastics;  Laterality: Bilateral;   CHOLECYSTECTOMY     COLONOSCOPY     dental work     FRACTURE SURGERY     left - right arm - left arm   PART MASTECTOMY,RADIO FREQUENCY LOCALIZER,AXILLARY SENTINEL NODE BIOPSY Left 01/02/2021   Procedure: PART MASTECTOMY,RADIO FREQUENCY LOCALIZER,AXILLARY SENTINEL NODE BIOPSY;  Surgeon: Benjamine Sprague, DO;  Location: [facility];  Service: General;  Laterality: Left;   TOTAL KNEE ARTHROPLASTY Left 01/26/2020   Procedure: TOTAL KNEE ARTHROPLASTY;  Surgeon: Corky Mull, MD;  Location: [facility];  Service: Orthopedics;  Laterality: Left;    Social History: Social History   Socioeconomic History   Marital status: Divorced    Spouse name: Not on file   Number of children: 2   Years of education: Not on file  Highest education level: Not on file  Occupational History   Not on file  Tobacco Use   Smoking status: Never   Smokeless tobacco: Never  Vaping Use   Vaping Use: Never used  Substance and Sexual Activity   Alcohol use: Yes    Comment: occasional   Drug use: Never   Sexual activity: Not on file  Other Topics Concern   Not on file  Social History Narrative   2 adult sons in the home    Social Determinants of Health   Financial Resource Strain: Not on file  Food Insecurity: Not on file  Transportation Needs: Not on file  Physical Activity: Not on file  Stress: Not on file  Social Connections: Not on file  Intimate Partner Violence: Not on file    Family History: Family History  Problem Relation Age of Onset   Breast cancer Mother 102   Cancer - Other Father        glioblastoma x2, d. 64   Lung cancer Maternal Grandmother        hx smoking   AAA (abdominal aortic aneurysm) Maternal Grandfather    Colon cancer Paternal Grandfather        dx 53s, d. 69s   Breast cancer Other     Review of Systems: ROS Positive for weight loss  Physical Exam: Vital Signs There were no vitals taken for this visit.  Physical Exam  Constitutional:      General: Not in acute distress.    Appearance: Normal appearance. Not ill-appearing.  HENT:     Head: Normocephalic and atraumatic.  Eyes:     Pupils: Pupils are equal, round. Cardiovascular:     Rate and Rhythm: Normal rate. Pulmonary:     Effort: No respiratory distress or increased work of breathing.  Speaks in full sentences. Musculoskeletal: Normal range of motion. No lower extremity swelling or edema. No varicosities.  Skin:    General: Skin is warm and dry.     Findings: No erythema or rash.  Neurological:     Mental Status: Alert and oriented to person, place, and time.  Psychiatric:        Mood and Affect: Mood normal.        Behavior: Behavior normal.    Assessment/Plan: The patient is scheduled for abdominoplasty with Dr. Marla Roe.  Risks, benefits, and alternatives of procedure discussed, questions answered and consent obtained.    Smoking Status: Non-smoker  Caprini Score: 7; Risk Factors include: Age, BMI, previous malignancy, length of surgery; Recommendation is early ambulation.  The patient has had Lovenox previously with knee surgery and bariatric surgery.  We will discuss potential Lovenox  at the time of surgery.  Pictures obtained: Previous visit  Post-op Rx sent to pharmacy: Keflex, Oxycodone  Patient was provided with the General Surgical Risk consent document and Pain Medication Agreement prior to their appointment.  They had adequate time to read through the risk consent documents and Pain Medication Agreement. We also discussed them in person together during this preop appointment. All of their questions were answered to their satisfaction.  Recommended calling if they have any further questions.  Risk consent form and Pain Medication Agreement to be scanned into patient's chart.     Electronically signed by: Stevie Kern Tien Spooner, PA-C 08/22/2022 8:50 AM

## 2022-08-30 ENCOUNTER — Other Ambulatory Visit: Payer: Self-pay | Admitting: Oncology

## 2022-09-12 ENCOUNTER — Other Ambulatory Visit: Payer: BC Managed Care – PPO

## 2022-09-12 ENCOUNTER — Ambulatory Visit: Payer: BC Managed Care – PPO | Admitting: Oncology

## 2022-09-15 ENCOUNTER — Other Ambulatory Visit: Payer: BC Managed Care – PPO

## 2022-09-15 DIAGNOSIS — Z411 Encounter for cosmetic surgery: Secondary | ICD-10-CM

## 2022-09-16 ENCOUNTER — Ambulatory Visit: Payer: BC Managed Care – PPO | Admitting: Oncology

## 2022-09-16 ENCOUNTER — Ambulatory Visit (INDEPENDENT_AMBULATORY_CARE_PROVIDER_SITE_OTHER): Payer: Self-pay | Admitting: Physician Assistant

## 2022-09-16 DIAGNOSIS — Z9889 Other specified postprocedural states: Secondary | ICD-10-CM

## 2022-09-16 NOTE — Progress Notes (Signed)
Ms. Fadness is a 67 year old female who is status post abdominoplasty yesterday on 09/16/2022 by Dr. Marla Roe.  She was reached via telephone today for postop visit.  She is located in [place], I am located in our office in [place].  Overall the patient notes she is doing well, she has some discomfort but no severe pain, and this is easily managed.  She notes the drains have been putting out fluid but not in excess.  She notes she has been up ambulating every several hours.  Physical exam.  Televisit.  Patient speaking in full sentences in no acute distress.  Overall the patient is doing well postop day 1.  She has no significant issues or complaints.  We plan to see the patient in our office for her regular scheduled follow-up unless she develops any new or worsening signs and symptoms in that event she will reach out to [place] immediately.  The patient verbalized understanding and agreement to today's plan had no further questions or concerns.  I spent a total of 10 minutes on this visit today.

## 2022-09-19 ENCOUNTER — Telehealth: Payer: Self-pay

## 2022-09-19 NOTE — Telephone Encounter (Signed)
Pt left voicemail stating she saw what looked like a blood clot sitting in one of her drain tubes and wanted to know if that was normal. She stated before someone called her back from our office, when dumping the contents in the drain the blood clot came down into the bulb later on.   I called pt back and adv her that that is normal to see. She denied cloudy looking drainage, a foul odor, dark colored blood, fever, nausea, vomiting. She adv that she was a little bloated and sore but assumed that it was coming from the swelling.   I adv pt to call us back if any of these symptoms occurred and would send a message to a PA to make them aware of our conversation. She conveyed understanding.

## 2022-09-22 NOTE — Telephone Encounter (Signed)
Thanks Vevelyn Royals, I agree. Appreciate you discussing that with her.

## 2022-09-25 ENCOUNTER — Encounter: Payer: Self-pay | Admitting: Surgical

## 2022-09-25 ENCOUNTER — Ambulatory Visit (INDEPENDENT_AMBULATORY_CARE_PROVIDER_SITE_OTHER): Payer: Self-pay | Admitting: Surgical

## 2022-09-25 VITALS — BP 137/76

## 2022-09-25 DIAGNOSIS — Z9889 Other specified postprocedural states: Secondary | ICD-10-CM

## 2022-09-25 NOTE — Progress Notes (Signed)
Patient is a 67 year old female here for follow-up after abdominoplasty on 09/16/2022 with Dr. Marla Roe.  She is approximately 9 days postop.  Patient presents and reports she is overall doing well, reports she had some mild pain after surgery which was easily controlled with p.o. oxycodone, she has not been needing any additional pain medication recently.  She is not having any infectious symptoms.  She reports she has been having normal bowel movements.  She reports JP drain output has been greater on the right, approximately 40 to 50 cc on the right and 15 to 20 cc on the left per 24 hours.  She reports that the left is definitely draining much less.  She reports she has been receiving help from her boyfriend and daughter.  She has continued to wear compressive garments and has not had any particular issues with it.  Chaperone present on exam On exam umbilicus is viable, umbilical incision is intact and CDI.  Abdominal incision with dressings covering the majority of the incision, there is no subcutaneous fluid collections on exam.  No tenderness noted with palpation.  No erythema or cellulitic changes noted of the abdomen.  She does have a lot of bruising along the left lateral/superior buttock, likely liposuction related.  I do not appreciate any fluid collections in this area.  She has some mild pubic swelling and ecchymosis noted.  A/P:  We will plan to leave both drains in place for the next 3 to 5 days, recommend following up early next week for reevaluation.  We discussed that typically the left drain puts out significantly more drainage and is lower on the abdomen and therefore I am reluctant to remove it this early postoperatively.  I do not see any signs of infection on exam.  Recommend continuing to use Tylenol as needed for postoperative pain control.  There is no signs of any postoperative complications at this time.  Call with questions or concerns, we will plan to see her back next  Tuesday or Wednesday.

## 2022-09-27 ENCOUNTER — Other Ambulatory Visit: Payer: Self-pay | Admitting: Oncology

## 2022-09-30 ENCOUNTER — Ambulatory Visit (INDEPENDENT_AMBULATORY_CARE_PROVIDER_SITE_OTHER): Payer: Self-pay | Admitting: Physician Assistant

## 2022-09-30 VITALS — BP 114/66 | HR 62

## 2022-09-30 DIAGNOSIS — Z9889 Other specified postprocedural states: Secondary | ICD-10-CM

## 2022-09-30 NOTE — Progress Notes (Signed)
This is a 67 year old female who is status post abdominoplasty on 09/16/2022 by Dr. Marla Roe.  She is seen today in the clinic for postop follow-up and removal of drain.  Since her last office visit on 09/25/2022.  She notes she has been doing well.  She denies any infectious signs or symptoms.  She notes that the left drain has put out 1 to 2 cc/day while the right has continued to put out between 5 to 20 cc/day max.  She notes the right drain is somewhat bothersome.  She notes she has been up ambulating without difficulty.  Chaperone present.  On exam umbilicus is viable, incision clean dry and intact with no surrounding redness, abdominal incision is clean dry and intact once dressings were removed with no significant wounds surrounding redness or warmth.  No discharge.  Abdomen palpated with no areas of firmness or palpable fluid collections.  Overall the patient is doing very well.  I did remove the dressings as they were starting to come off.  The left drain is no longer putting out any significant volume, generally would like to leave the left one to be pulled last but given the volumes I did find it reasonable to pull it today.  Her right drain is not putting out large volumes either, I would like to leave it in for another couple of days and remove it on Thursday if she continues to put out low volumes.  The patient will be seen back in our office in 2 days, she was given strict return precautions.  She verbalized understanding and agreement to today's plan.

## 2022-10-01 ENCOUNTER — Inpatient Hospital Stay: Payer: BC Managed Care – PPO | Attending: Oncology | Admitting: Oncology

## 2022-10-01 ENCOUNTER — Encounter: Payer: Self-pay | Admitting: Oncology

## 2022-10-01 ENCOUNTER — Inpatient Hospital Stay: Payer: BC Managed Care – PPO

## 2022-10-01 VITALS — BP 127/69 | HR 62 | Temp 96.2°F | Wt 176.3 lb

## 2022-10-01 DIAGNOSIS — Z923 Personal history of irradiation: Secondary | ICD-10-CM | POA: Insufficient documentation

## 2022-10-01 DIAGNOSIS — Z808 Family history of malignant neoplasm of other organs or systems: Secondary | ICD-10-CM | POA: Insufficient documentation

## 2022-10-01 DIAGNOSIS — Z9884 Bariatric surgery status: Secondary | ICD-10-CM | POA: Diagnosis not present

## 2022-10-01 DIAGNOSIS — C50919 Malignant neoplasm of unspecified site of unspecified female breast: Secondary | ICD-10-CM

## 2022-10-01 DIAGNOSIS — Z800 Family history of malignant neoplasm of digestive organs: Secondary | ICD-10-CM | POA: Diagnosis not present

## 2022-10-01 DIAGNOSIS — Z8249 Family history of ischemic heart disease and other diseases of the circulatory system: Secondary | ICD-10-CM | POA: Insufficient documentation

## 2022-10-01 DIAGNOSIS — Z170 Estrogen receptor positive status [ER+]: Secondary | ICD-10-CM | POA: Insufficient documentation

## 2022-10-01 DIAGNOSIS — Z79811 Long term (current) use of aromatase inhibitors: Secondary | ICD-10-CM | POA: Insufficient documentation

## 2022-10-01 DIAGNOSIS — Z801 Family history of malignant neoplasm of trachea, bronchus and lung: Secondary | ICD-10-CM | POA: Insufficient documentation

## 2022-10-01 DIAGNOSIS — M2550 Pain in unspecified joint: Secondary | ICD-10-CM | POA: Insufficient documentation

## 2022-10-01 DIAGNOSIS — Z79899 Other long term (current) drug therapy: Secondary | ICD-10-CM | POA: Diagnosis not present

## 2022-10-01 DIAGNOSIS — C50812 Malignant neoplasm of overlapping sites of left female breast: Secondary | ICD-10-CM | POA: Insufficient documentation

## 2022-10-01 DIAGNOSIS — Z803 Family history of malignant neoplasm of breast: Secondary | ICD-10-CM | POA: Diagnosis not present

## 2022-10-01 LAB — CBC WITH DIFFERENTIAL/PLATELET
Abs Immature Granulocytes: 0.02 10*3/uL (ref 0.00–0.07)
Basophils Absolute: 0.1 10*3/uL (ref 0.0–0.1)
Basophils Relative: 1 %
Eosinophils Absolute: 0.5 10*3/uL (ref 0.0–0.5)
Eosinophils Relative: 6 %
HCT: 36.1 % (ref 36.0–46.0)
Hemoglobin: 11.6 g/dL — ABNORMAL LOW (ref 12.0–15.0)
Immature Granulocytes: 0 %
Lymphocytes Relative: 21 %
Lymphs Abs: 1.6 10*3/uL (ref 0.7–4.0)
MCH: 30.6 pg (ref 26.0–34.0)
MCHC: 32.1 g/dL (ref 30.0–36.0)
MCV: 95.3 fL (ref 80.0–100.0)
Monocytes Absolute: 0.7 10*3/uL (ref 0.1–1.0)
Monocytes Relative: 9 %
Neutro Abs: 4.8 10*3/uL (ref 1.7–7.7)
Neutrophils Relative %: 63 %
Platelets: 266 10*3/uL (ref 150–400)
RBC: 3.79 MIL/uL — ABNORMAL LOW (ref 3.87–5.11)
RDW: 13.7 % (ref 11.5–15.5)
WBC: 7.7 10*3/uL (ref 4.0–10.5)
nRBC: 0 % (ref 0.0–0.2)

## 2022-10-01 LAB — COMPREHENSIVE METABOLIC PANEL
ALT: 18 U/L (ref 0–44)
AST: 21 U/L (ref 15–41)
Albumin: 3.6 g/dL (ref 3.5–5.0)
Alkaline Phosphatase: 82 U/L (ref 38–126)
Anion gap: 8 (ref 5–15)
BUN: 18 mg/dL (ref 8–23)
CO2: 26 mmol/L (ref 22–32)
Calcium: 9.2 mg/dL (ref 8.9–10.3)
Chloride: 107 mmol/L (ref 98–111)
Creatinine, Ser: 0.7 mg/dL (ref 0.44–1.00)
GFR, Estimated: >60 mL/min (ref >60)
Glucose, Bld: 70 mg/dL (ref 70–99)
Potassium: 3.5 mmol/L (ref 3.5–5.1)
Sodium: 141 mmol/L (ref 135–145)
Total Bilirubin: 0.5 mg/dL (ref 0.3–1.2)
Total Protein: 6.8 g/dL (ref 6.5–8.1)

## 2022-10-01 MED ORDER — ANASTROZOLE 1 MG PO TABS: 1.0000 mg | ORAL_TABLET | Freq: Every day | ORAL | 1 refills | Status: DC

## 2022-10-01 NOTE — Assessment & Plan Note (Addendum)
#   Invasive lobular left breast cancer, ER/PR +, HER2 - mpT1bN0-  Oncotype DX recurrence score 17, -status post bilateral oncoplastic reduction-adjuvant radiation. Continue Arimidex '1mg'$  daily.  MRI breast results were reviewed and discussed with patient.  Obtain annual diagnostic mammgoram Repeat DEXA in May 2024

## 2022-10-01 NOTE — Assessment & Plan Note (Signed)
Check B12 and folate

## 2022-10-01 NOTE — Progress Notes (Signed)
Patient is a 67 year old female who underwent abdominoplasty on 09/16/2022 with Dr. Marla Roe.  Patient presents to the clinic today for follow-up and possible drain removal.  Patient was last seen in the clinic on 09/30/2022.  At this visit, she reported she was doing well.  She reported that the left drain was putting out approximately 1 to 2 cc/day while the right drain was putting out between 5 to cc per day.  On exam, the umbilicus was viable.  The incision was clean dry and intact with no surrounding redness.  There were no fluid collections palpated on exam.  The left drain was removed.  Plan was for patient to return to the clinic in a couple of days to have the right drain removed if she continues to put out low volumes.  Today, patient reports she is doing well.  She states that her drain has been putting out between 10 to 18 cc/day for the past few days.  She denies any other issues or concerns.  She states she has been applying Vaseline daily to her incision.  She denies any fevers or chills.  Chaperone present on exam.  On exam, patient is sitting upright in no acute distress.  Abdomen is soft and nontender.  There is a small area of firmness just superior of the umbilicus.  This appears to be consistent with some scar tissue.  There are no significant fluid collections palpated on exam.  There is no overlying skin changes to this area.  The umbilicus appears to be viable.  There appears to be some mild irritation to the umbilicus.  The inferior abdominal incision appears to be intact.  There are some superficial wounds noted.  There is no drainage, erythema or swelling to the incision.  Right JP drain is in place and functioning.  There is serous drainage in the bulb.  Drain was removed without difficulty.  Patient tolerated well.  I discussed with the patient to gently massage the area just above her umbilicus.  I discussed with the patient that she should make sure she showers allowing soapy  water to run over the umbilicus, and to make sure the area is pat dry after the shower.  Patient expressed understanding.  I discussed with the patient that she should continue Vaseline to her inferior abdominal incision.  I discussed with her that she can apply Vaseline to the drain sites daily and cover with gauze or a bandage.  Patient expressed understanding.  I discussed with the patient to continue to wear compression at all times and avoid strenuous activities.  I discussed with her that she may transition into a compressive spanks if she would like.  I I recommended that she apply ABD pads over the incision.  Patient expressed understanding.  I discussed with the patient that she should continue to monitor her umbilicus and her incision.  I discussed with her if that if anything were to become red, tender, if she were to experience any drainage, or swelling, or fevers or chills, she should call us.  Patient expressed understanding.  Patient has a scheduled appointment with Dr. Marla Roe next Tuesday.  We will plan for patient to come in then.  I instructed the patient to call in the meantime if she has any questions or concerns.

## 2022-10-01 NOTE — Progress Notes (Signed)
Hematology/Oncology Progress note [phone] [phone]     Chief Complaint: Holly Henderson is a 67 y.o. female presents for follow-up of left breast lobular carcinoma.  ASSESSMENT & PLAN:   Cancer Staging  Malignant neoplasm of overlapping sites of left breast in female, estrogen receptor positive ([place]) Staging form: Breast, AJCC 8th Edition - Clinical stage from 12/21/2020: Stage IA (cT1b, cN0, cM0, G1, ER+, PR+, HER2-, Oncotype DX score: 17) - Signed by Earlie Server, MD on 10/01/2022   Malignant neoplasm of overlapping sites of left breast in female, estrogen receptor positive ([place]) # Invasive lobular left breast cancer, ER/PR +, HER2 - mpT1bN0-  Oncotype DX recurrence score 17, -status post bilateral oncoplastic reduction-adjuvant radiation. Continue Arimidex '1mg'$  daily.  MRI breast results were reviewed and discussed with patient.  Obtain annual diagnostic mammgoram Repeat DEXA in May 2024  History of gastric bypass Check B12 and folate   Orders Placed This Encounter  Procedures   MM DIAG BREAST TOMO UNI LEFT    Standing Status:   Future    Standing Expiration Date:   10/02/2023    Order Specific Question:   Reason for Exam (SYMPTOM  OR DIAGNOSIS REQUIRED)    Answer:   history of brest cancer    Order Specific Question:   Preferred imaging location?    Answer:   [facility]   US BREAST LTD UNI LEFT INC AXILLA    Standing Status:   Future    Standing Expiration Date:   10/02/2023    Order Specific Question:   Reason for Exam (SYMPTOM  OR DIAGNOSIS REQUIRED)    Answer:   history of breast cancer    Order Specific Question:   Preferred imaging location?    Answer:   [facility]   US BREAST LTD UNI RIGHT INC AXILLA    Standing Status:   Future    Standing Expiration Date:   10/02/2023    Order Specific Question:   Reason for Exam (SYMPTOM  OR DIAGNOSIS REQUIRED)    Answer:   history of breast cancer    Order Specific Question:   Preferred  imaging location?    Answer:   [facility]   DG Bone Density    Standing Status:   Future    Standing Expiration Date:   10/02/2023    Order Specific Question:   Reason for Exam (SYMPTOM  OR DIAGNOSIS REQUIRED)    Answer:   history of breast cancer    Order Specific Question:   Preferred imaging location?    Answer:   [facility]   CBC with Differential/Platelet    Standing Status:   Future    Standing Expiration Date:   10/02/2023   Comprehensive metabolic panel    Standing Status:   Future    Standing Expiration Date:   10/01/2023   Vitamin B12    Standing Status:   Future    Standing Expiration Date:   10/02/2023   Folate    Standing Status:   Future    Standing Expiration Date:   10/02/2023   Follow up in 6 months.  All questions were answered. The patient knows to call the clinic with any problems, questions or concerns.  Earlie Server, MD, PhD [facility] Hematology Oncology 10/01/2022    PERTINENT ONCOLOGY HISTORY [name] is a 67 y.o.afemale who has above oncology history reviewed by me today presented for follow up visit for management of left breast lobular carcinoma  Patient  previously followed up by Dr.Corcoran, patient switched care to me on 12/22/20 Extensive medical records review was performed by me 10/11/2020 Bilateral screening mammogram on  revealed asymmetry with distortion in the left breast.   10/29/2020 Left diagnostic mammogram  revealed focal asymmetry in the upper-outer quadrant of the LEFT breast, with possible subtle associated architectural distortion.  11/05/2020, left upper outer quadrant breast mass biopsy showed grade 1 invasive lobular carcinoma, ER 90%, PR 90%, HER2 negative IHC  11/16/2020 bilateral breast MRI revealed suspicious findings in the left breast. Recommended 3 site biopsy in the left breast: 1) 0.5 cm mass medial upper left breast. 2) 1 cm area of focal enhancement posterior lateral lower left breast. 3) Most posterior  aspect of enhancement in the previously biopsy tumor area to assess posterior extent of disease.  11/29/2020 patient underwent additional biopsy of the left breast Lateral mid left breast needle biopsy  revealed invasive lobular carcinoma with DCIS and lobular neoplasia- (atypical lobular hyperplasia) Lower outer left breast needle biopsy revealed a focus suspicious for perineural invasion and lobular neoplasia (atypical lobular hyperplasia).  Upper inner left breast biopsy revealed focal fat necrosis with associated inflammation. Tumor cells were ER + (85%), PR + (95%), HER2 - (1+). Ki67 was 2%.  Patient's case was discussed on breast tumor board 12/17/2020. Area of concern spans 11-12 centimeters in all sites will need to be resected.  Patient has established care with Dr. Lysle Pearl and Dr. Marla Roe. Patient has decided on bilateral oncoplastic breast reduction.  # 01/02/2021, patient underwent bilateral oncoplastic breast reduction Pathology showed DIAGNOSIS:  A. BREAST, LEFT; EXCISION: - BENIGN MAMMARY PARENCHYMA WITH FOCAL LOBULAR NEOPLASIA.  - CLIPS X2 (DUMBBELL AND CYLINDER), RF TAGS X2, AND BIOPSY SITES IDENTIFIED.  - NO EVIDENCE OF RESIDUAL INVASIVE CARCINOMA.  B. BREAST, LEFT LATERAL; EXCISION:  - INVASIVE LOBULAR CARCINOMA. - SINGLE RF TAG IDENTIFIED; NO BIOPSY CLIPS PRESENT. - BACKGROUND LOBULAR NEOPLASIA.  C. BREAST, LEFT INFERIOR/POSTERIOR; EXCISION:  - LOBULAR NEOPLASIA. - CLIPS X2 (DUMBBELL AND X), RF TAG X1, AND BIOPSY SITES IDENTIFIED. - NO EVIDENCE OF RESIDUAL INVASIVE CARCINOMA. - SEE CANCER SUMMARY.  D. LYMPH NODE, LEFT SENTINEL #1; EXCISION:  - TWO LYMPH NODES, NEGATIVE FOR MALIGNANCY (0/2).  E. BREAST, RIGHT; MASTOPEXY:  - BENIGN SKIN AND MAMMARY PARENCHYMA. - NEGATIVE FOR ATYPICAL PROLIFERATIVE BREAST DISEASE.  F. BREAST, LEFT; MASTOPEXY:  - BENIGN SKIN AND SUBCUTANEOUS TISSUE. - NEGATIVE FOR MALIGNANCY.    # Genetic testing negative. #Patient received adjuvant  radiation and was started on adjuvant endocrine therapy with Arimidex after her 04/02/2021 visits. 05/16/21 Gastric bypass surgery.   INTERVAL HISTORY [name] is a 67 y.o. female who has above history reviewed by me today presents for follow up visit for left breast lobular carcinoma Patient has been on Arimidex since August 2022.  She reports feeling well She has no new complaints today. Recent panniculitis surgery.     Review of Systems  Constitutional:  Negative for chills and fever.  HENT:   Negative for sore throat.   Eyes:  Negative for eye problems.  Respiratory:  Negative for cough, shortness of breath and wheezing.   Cardiovascular:  Negative for chest pain and palpitations.  Gastrointestinal:  Negative for abdominal pain, blood in stool, nausea and vomiting.  Endocrine: Negative for hot flashes.  Genitourinary:  Negative for dysuria.   Musculoskeletal:  Positive for arthralgias. Negative for myalgias.  Skin:  Negative for rash.  Neurological:  Negative for dizziness.  Hematological:  Does not  bruise/bleed easily.     Past Medical History:  Diagnosis Date   Diabetes mellitus without complication ([place])    Family history of breast cancer    Family history of colon cancer    Family history of glioblastoma    Hypertension    Malignant neoplasm of upper-outer quadrant of left female breast ([place]) 11/13/2020   Personal history of radiation therapy     Past Surgical History:  Procedure Laterality Date   BREAST BIOPSY Left 11/05/2020   stereo bx, x-clip, positive   BREAST LUMPECTOMY Left 01/02/2021   BREAST REDUCTION SURGERY Bilateral 01/02/2021   Procedure: BILATERAL ONCOPLASTIC BREAST REDUCTION;  Surgeon: Wallace Going, DO;  Location: [facility] ORS;  Service: Plastics;  Laterality: Bilateral;   CHOLECYSTECTOMY     COLONOSCOPY     dental work     FRACTURE SURGERY     left - right arm - left arm   PART MASTECTOMY,RADIO FREQUENCY LOCALIZER,AXILLARY SENTINEL  NODE BIOPSY Left 01/02/2021   Procedure: PART MASTECTOMY,RADIO FREQUENCY LOCALIZER,AXILLARY SENTINEL NODE BIOPSY;  Surgeon: Benjamine Sprague, DO;  Location: [facility];  Service: General;  Laterality: Left;   TOTAL KNEE ARTHROPLASTY Left 01/26/2020   Procedure: TOTAL KNEE ARTHROPLASTY;  Surgeon: Corky Mull, MD;  Location: [facility];  Service: Orthopedics;  Laterality: Left;    Family History  Problem Relation Age of Onset   Breast cancer Mother 56   Cancer - Other Father        glioblastoma x2, d. 78   Lung cancer Maternal Grandmother        hx smoking   AAA (abdominal aortic aneurysm) Maternal Grandfather    Colon cancer Paternal Grandfather        dx 85s, d. 6s   Breast cancer Other     Social History:  reports that she has never smoked. She has never used smokeless tobacco. She reports current alcohol use. She reports that she does not use drugs. She has never smoked. She has not had any alcohol since she was diagnosed with diabetes. She denies exposure to radiation or toxins. She has 6 children. She previously lived in [place]. 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 The patient is alone today.  Allergies:  Allergies  Allergen Reactions   Latex Other (See Comments)    Irritation/gyn    Current Medications: Current Outpatient Medications  Medication Sig Dispense Refill   Calcium 500 MG tablet Take 500 mg by mouth in the morning, at noon, and at bedtime.     hydrochlorothiazide (HYDRODIURIL) 25 MG tablet Take 1 tablet by mouth daily.     losartan (COZAAR) 50 MG tablet Take 1 tablet by mouth daily.     Multiple Vitamins-Minerals (MULTIVITAMIN WITH MINERALS) tablet Take 1 tablet by mouth daily.     anastrozole (ARIMIDEX) 1 MG tablet Take 1 tablet (1 mg total) by mouth daily. 90 tablet 1   No current facility-administered medications for this visit.     Performance status (ECOG): 1  Vitals Blood pressure 127/69, pulse 62, temperature (!) 96.2 F (35.7 C), temperature source  Tympanic, weight 176 lb 4.8 oz (80 kg), SpO2 100 %.   Physical Exam Vitals and nursing note reviewed.  Constitutional:      General: She is not in acute distress.    Appearance: She is not diaphoretic.  Eyes:     General: No scleral icterus.    Conjunctiva/sclera: Conjunctivae normal.  Cardiovascular:     Rate and Rhythm: Normal rate and regular  rhythm.     Pulses: Normal pulses.  Pulmonary:     Effort: Pulmonary effort is normal.     Breath sounds: Normal breath sounds.  Abdominal:     General: There is no distension.  Musculoskeletal:        General: No swelling.     Cervical back: Normal range of motion.  Skin:    General: Skin is warm and dry.  Neurological:     General: No focal deficit present.     Mental Status: She is alert and oriented to person, place, and time.  Psychiatric:        Mood and Affect: Mood normal.      Laboratory findings    Latest Ref Rng & Units 10/01/2022    1:53 PM 03/10/2022    1:36 PM 09/10/2021    1:00 PM  CBC  WBC 4.0 - 10.5 K/uL 7.7  5.0  5.7   Hemoglobin 12.0 - 15.0 g/dL 11.6  12.7  12.3   Hematocrit 36.0 - 46.0 % 36.1  38.5  37.1   Platelets 150 - 400 K/uL 266  198  198       Latest Ref Rng & Units 10/01/2022    1:53 PM 03/10/2022    1:36 PM 09/10/2021    1:00 PM  CMP  Glucose 70 - 99 mg/dL 70  83  109   BUN 8 - 23 mg/dL '18  20  16   '$ Creatinine 0.44 - 1.00 mg/dL 0.70  0.87  0.65   Sodium 135 - 145 mmol/L 141  141  141   Potassium 3.5 - 5.1 mmol/L 3.5  3.8  3.6   Chloride 98 - 111 mmol/L 107  109  108   CO2 22 - 32 mmol/L '26  28  26   '$ Calcium 8.9 - 10.3 mg/dL 9.2  9.8  9.6   Total Protein 6.5 - 8.1 g/dL 6.8  7.1  7.1   Total Bilirubin 0.3 - 1.2 mg/dL 0.5  0.5  0.6   Alkaline Phos 38 - 126 U/L 82  77  75   AST 15 - 41 U/L 21  39  29   ALT 0 - 44 U/L 18  36  27       Preoperation CA 27-29 is normal 28.6

## 2022-10-02 ENCOUNTER — Encounter: Payer: Self-pay | Admitting: Student

## 2022-10-02 ENCOUNTER — Ambulatory Visit (INDEPENDENT_AMBULATORY_CARE_PROVIDER_SITE_OTHER): Payer: Self-pay | Admitting: Student

## 2022-10-02 VITALS — BP 134/75 | HR 79

## 2022-10-02 DIAGNOSIS — Z9889 Other specified postprocedural states: Secondary | ICD-10-CM

## 2022-10-07 ENCOUNTER — Encounter: Payer: Self-pay | Admitting: Plastic Surgery

## 2022-10-07 ENCOUNTER — Ambulatory Visit (INDEPENDENT_AMBULATORY_CARE_PROVIDER_SITE_OTHER): Payer: Self-pay | Admitting: Plastic Surgery

## 2022-10-07 VITALS — BP 147/81 | HR 66

## 2022-10-07 DIAGNOSIS — M793 Panniculitis, unspecified: Secondary | ICD-10-CM

## 2022-10-07 NOTE — Progress Notes (Signed)
The patient is a 67 year old female here for follow-up after undergoing an abdominoplasty.  Overall she is looking really good.  There is just a little irritation at the incision site.  She has been using Vaseline and keeping it nice and clean.  I think that the abdominal binder might be a little bit irritating.  I would like her to go ahead into a spanks.  I am also going to see if I can get her some Medihoney that she can put on there once a day for the next week and see if that helps to calm it down.  I would like to see the patient back in about 2 weeks.  Patient is in agreement.

## 2022-10-08 ENCOUNTER — Telehealth: Payer: Self-pay

## 2022-10-08 NOTE — Telephone Encounter (Signed)
Faxed wound supply order for MediHoney gel to [facility] with demographics, insurance card and most recent ov note attached.   Received correspondence from [facility] on 2/14 that were some issues delaying service to the pt due to her deductible not being satisfied yet. They stated that they will contact the pt with pricing options.   Forwarding order and [facility] correspondence to front desk x batch scanning.

## 2022-10-20 ENCOUNTER — Ambulatory Visit
Admission: RE | Admit: 2022-10-20 | Discharge: 2022-10-20 | Disposition: A | Discharge status: Home / Self Care | Payer: BC Managed Care – PPO | Source: Ambulatory Visit | Attending: Oncology | Admitting: Oncology

## 2022-10-20 ENCOUNTER — Other Ambulatory Visit: Payer: Self-pay | Admitting: Oncology

## 2022-10-20 DIAGNOSIS — Z170 Estrogen receptor positive status [ER+]: Secondary | ICD-10-CM

## 2022-10-20 DIAGNOSIS — Z9884 Bariatric surgery status: Secondary | ICD-10-CM

## 2022-10-20 DIAGNOSIS — C50919 Malignant neoplasm of unspecified site of unspecified female breast: Secondary | ICD-10-CM

## 2022-10-20 DIAGNOSIS — Z79811 Long term (current) use of aromatase inhibitors: Secondary | ICD-10-CM

## 2022-10-21 ENCOUNTER — Ambulatory Visit (INDEPENDENT_AMBULATORY_CARE_PROVIDER_SITE_OTHER): Payer: Self-pay | Admitting: Surgical

## 2022-10-21 DIAGNOSIS — M793 Panniculitis, unspecified: Secondary | ICD-10-CM

## 2022-10-21 DIAGNOSIS — Z9889 Other specified postprocedural states: Secondary | ICD-10-CM

## 2022-10-21 NOTE — Progress Notes (Signed)
Patient is a 67 year old female here for follow-up after abdominoplasty on 09/16/2022 with Dr. Marla Roe.  She is 5 weeks postop today.  Chaperone present on exam On exam: Umbilicus incision is intact and CDI.  Umbilicus is viable.  There is no erythema or cellulitic changes of her abdomen or incisional areas.  She does have some mild irritation on various sites along the abdominal incision, however this appears to be reactive to sutures and there is no signs of infection or concern.  I do not appreciate any tenderness with palpation of her abdomen.  She does have some fullness above the umbilicus that is mildly tender to palpation but no fluid collection or fluctuance noted.   A/P:  1 more week of continue to avoid strenuous activities or heavy lifting, continue with compressive garments 24/7 for 1 more week.  Continue spanks or abdominal binder, patient preference or both. Recommend avoiding carrying grandchildren for 1 more week.  She can begin stationary biking this week, increase intensity as tolerated.  I do not see any signs of infection or concern on exam after today's evaluation.  All of her questions were answered to her content.  She can begin using scar cream.  Recommend following up in 3 weeks for reevaluation.  Pictures were obtained of the patient and placed in the chart with the patient's or guardian's permission.

## 2022-11-18 ENCOUNTER — Ambulatory Visit (INDEPENDENT_AMBULATORY_CARE_PROVIDER_SITE_OTHER): Payer: Self-pay | Admitting: Surgical

## 2022-11-18 VITALS — BP 130/78 | HR 64

## 2022-11-18 DIAGNOSIS — Z9889 Other specified postprocedural states: Secondary | ICD-10-CM

## 2022-11-18 NOTE — Progress Notes (Signed)
Patient is a 67 year old female here for follow-up after abdominoplasty with Dr. Marla Roe on 09/16/2022.  She is 2 months postop.  She reports she is doing really well, she reports some occasional tenderness in the right lateral abdomen.  She is otherwise healing well and has no concerns.  She does have some questions about liposuction of her lateral abdomen/flanks and thigh lift  Chaperone present on exam On exam umbilicus is viable, umbilical incision is intact and CDI.  No erythema or surrounding skin changes noted.  She does have some fullness and firmness above the umbilicus.  Abdominal incision is intact and healing very well.  There is no surrounding erythema or cellulitic changes noted.  No subcutaneous fluid collections noted palpation.   A/P:  No restrictions at this time, increase activity as tolerated.  Wear compressive garments as needed or when more active. No signs of infection or concern on exam. All of her questions were answered to her content.  We will plan to see her back on as-needed basis.

## 2023-01-13 ENCOUNTER — Ambulatory Visit
Admission: RE | Admit: 2023-01-13 | Discharge: 2023-01-13 | Disposition: A | Discharge status: Home / Self Care | Payer: BC Managed Care – PPO | Source: Ambulatory Visit | Attending: Oncology | Admitting: Oncology

## 2023-01-13 DIAGNOSIS — Z79811 Long term (current) use of aromatase inhibitors: Secondary | ICD-10-CM

## 2023-01-13 DIAGNOSIS — C50919 Malignant neoplasm of unspecified site of unspecified female breast: Secondary | ICD-10-CM

## 2023-03-26 ENCOUNTER — Other Ambulatory Visit: Payer: Self-pay

## 2023-03-26 ENCOUNTER — Encounter: Payer: Self-pay | Admitting: Oncology

## 2023-03-26 MED ORDER — ANASTROZOLE 1 MG PO TABS: 1.0000 mg | ORAL_TABLET | Freq: Every day | ORAL | 0 refills | Status: DC

## 2023-04-01 ENCOUNTER — Inpatient Hospital Stay: Payer: BC Managed Care – PPO | Admitting: Oncology

## 2023-04-01 ENCOUNTER — Encounter: Payer: Self-pay | Admitting: Oncology

## 2023-04-01 ENCOUNTER — Inpatient Hospital Stay: Payer: BC Managed Care – PPO | Attending: Oncology

## 2023-04-01 VITALS — BP 137/70 | HR 62 | Temp 96.1°F | Resp 18 | Wt 181.9 lb

## 2023-04-01 DIAGNOSIS — Z170 Estrogen receptor positive status [ER+]: Secondary | ICD-10-CM | POA: Insufficient documentation

## 2023-04-01 DIAGNOSIS — Z808 Family history of malignant neoplasm of other organs or systems: Secondary | ICD-10-CM | POA: Diagnosis not present

## 2023-04-01 DIAGNOSIS — Z803 Family history of malignant neoplasm of breast: Secondary | ICD-10-CM | POA: Insufficient documentation

## 2023-04-01 DIAGNOSIS — Z79811 Long term (current) use of aromatase inhibitors: Secondary | ICD-10-CM | POA: Insufficient documentation

## 2023-04-01 DIAGNOSIS — Z9884 Bariatric surgery status: Secondary | ICD-10-CM

## 2023-04-01 DIAGNOSIS — Z9049 Acquired absence of other specified parts of digestive tract: Secondary | ICD-10-CM | POA: Diagnosis not present

## 2023-04-01 DIAGNOSIS — M2550 Pain in unspecified joint: Secondary | ICD-10-CM | POA: Insufficient documentation

## 2023-04-01 DIAGNOSIS — Z79899 Other long term (current) drug therapy: Secondary | ICD-10-CM | POA: Diagnosis not present

## 2023-04-01 DIAGNOSIS — Z800 Family history of malignant neoplasm of digestive organs: Secondary | ICD-10-CM | POA: Insufficient documentation

## 2023-04-01 DIAGNOSIS — C50919 Malignant neoplasm of unspecified site of unspecified female breast: Secondary | ICD-10-CM

## 2023-04-01 DIAGNOSIS — Z923 Personal history of irradiation: Secondary | ICD-10-CM | POA: Insufficient documentation

## 2023-04-01 DIAGNOSIS — Z8249 Family history of ischemic heart disease and other diseases of the circulatory system: Secondary | ICD-10-CM | POA: Diagnosis not present

## 2023-04-01 DIAGNOSIS — C50812 Malignant neoplasm of overlapping sites of left female breast: Secondary | ICD-10-CM

## 2023-04-01 DIAGNOSIS — M8580 Other specified disorders of bone density and structure, unspecified site: Secondary | ICD-10-CM | POA: Insufficient documentation

## 2023-04-01 DIAGNOSIS — Z801 Family history of malignant neoplasm of trachea, bronchus and lung: Secondary | ICD-10-CM | POA: Diagnosis not present

## 2023-04-01 LAB — COMPREHENSIVE METABOLIC PANEL
ALT: 22 U/L (ref 0–44)
AST: 27 U/L (ref 15–41)
Albumin: 4.3 g/dL (ref 3.5–5.0)
Alkaline Phosphatase: 80 U/L (ref 38–126)
Anion gap: 6 (ref 5–15)
BUN: 17 mg/dL (ref 8–23)
CO2: 26 mmol/L (ref 22–32)
Calcium: 9.5 mg/dL (ref 8.9–10.3)
Chloride: 106 mmol/L (ref 98–111)
Creatinine, Ser: 0.94 mg/dL (ref 0.44–1.00)
GFR, Estimated: >60 mL/min (ref >60)
Glucose, Bld: 134 mg/dL — ABNORMAL HIGH (ref 70–99)
Potassium: 3.6 mmol/L (ref 3.5–5.1)
Sodium: 138 mmol/L (ref 135–145)
Total Bilirubin: 0.4 mg/dL (ref 0.3–1.2)
Total Protein: 7.3 g/dL (ref 6.5–8.1)

## 2023-04-01 LAB — CBC WITH DIFFERENTIAL/PLATELET
Abs Immature Granulocytes: 0.02 10*3/uL (ref 0.00–0.07)
Basophils Absolute: 0 10*3/uL (ref 0.0–0.1)
Basophils Relative: 1 %
Eosinophils Absolute: 0.3 10*3/uL (ref 0.0–0.5)
Eosinophils Relative: 4 %
HCT: 37.1 % (ref 36.0–46.0)
Hemoglobin: 12.1 g/dL (ref 12.0–15.0)
Immature Granulocytes: 0 %
Lymphocytes Relative: 20 %
Lymphs Abs: 1.3 10*3/uL (ref 0.7–4.0)
MCH: 30.8 pg (ref 26.0–34.0)
MCHC: 32.6 g/dL (ref 30.0–36.0)
MCV: 94.4 fL (ref 80.0–100.0)
Monocytes Absolute: 0.5 10*3/uL (ref 0.1–1.0)
Monocytes Relative: 8 %
Neutro Abs: 4.4 10*3/uL (ref 1.7–7.7)
Neutrophils Relative %: 67 %
Platelets: 221 10*3/uL (ref 150–400)
RBC: 3.93 MIL/uL (ref 3.87–5.11)
RDW: 13.4 % (ref 11.5–15.5)
WBC: 6.5 10*3/uL (ref 4.0–10.5)
nRBC: 0 % (ref 0.0–0.2)

## 2023-04-01 LAB — FOLATE: Folate: >40 ng/mL (ref >5.9)

## 2023-04-01 LAB — VITAMIN B12: Vitamin B-12: 728 pg/mL (ref 180–914)

## 2023-04-01 MED ORDER — ANASTROZOLE 1 MG PO TABS: 1.0000 mg | ORAL_TABLET | Freq: Every day | ORAL | 1 refills | Status: DC

## 2023-04-01 NOTE — Assessment & Plan Note (Addendum)
DEXA scan showed osteopenia. FRAX major osteoporotic fracture risk is 8%. Recommend calcium and vitamin D

## 2023-04-01 NOTE — Assessment & Plan Note (Signed)
Check B12 and folate 

## 2023-04-01 NOTE — Progress Notes (Signed)
Hematology/Oncology Progress note [phone] [phone]     Chief Complaint: Holly Henderson is a 67 y.o. female presents for follow-up of left breast lobular carcinoma.  ASSESSMENT & PLAN:   Cancer Staging  Malignant neoplasm of overlapping sites of left breast in female, estrogen receptor positive (HCC) Staging form: Breast, AJCC 8th Edition - Clinical stage from 12/21/2020: Stage IA (cT1b, cN0, cM0, G1, ER+, PR+, HER2-, Oncotype DX score: 17) - Signed by Rickard Patience, MD on 10/01/2022   Malignant neoplasm of overlapping sites of left breast in female, estrogen receptor positive (HCC) # Invasive lobular left breast cancer, ER/PR +, HER2 - mpT1bN0-  Oncotype DX recurrence score 17, -status post bilateral oncoplastic reduction-adjuvant radiation. Continue Arimidex 1mg  daily.  Mammogram results reviewed and discussed with patient. Obtain MRI breast bilaterally in 6 months.   History of gastric bypass Check B12 and folate  Osteopenia DEXA scan showed osteopenia. FRAX major osteoporotic fracture risk is 8%. Recommend calcium and vitamin D     Orders Placed This Encounter  Procedures   MR BREAST BILATERAL W WO CONTRAST INC CAD    Standing Status:   Future    Standing Expiration Date:   03/31/2024    Order Specific Question:   If indicated for the ordered procedure, I authorize the administration of contrast media per Radiology protocol    Answer:   Yes    Order Specific Question:   What is the patient's sedation requirement?    Answer:   No Sedation    Order Specific Question:   Does the patient have a pacemaker or implanted devices?    Answer:   No    Order Specific Question:   Radiology Contrast Protocol - do NOT remove file path    Answer:   [email or web address]    Order Specific Question:   Preferred imaging location?    Answer:   [facility] (table limit - 550lbs)   CMP (Cancer Center only)    Standing  Status:   Future    Standing Expiration Date:   03/31/2024   CBC with Differential (Cancer Center Only)    Standing Status:   Future    Standing Expiration Date:   03/31/2024   Follow up in 6 months.  All questions were answered. The patient knows to call the clinic with any problems, questions or concerns.  Rickard Patience, MD, PhD [facility] Hematology Oncology 04/01/2023    PERTINENT ONCOLOGY HISTORY Holly Henderson is a 67 y.o.afemale who has above oncology history reviewed by me today presented for follow up visit for management of left breast lobular carcinoma  Patient previously followed up by Dr.Corcoran, patient switched care to me on 12/22/20 Extensive medical records review was performed by me 10/11/2020 Bilateral screening mammogram on  revealed asymmetry with distortion in the left breast.   10/29/2020 Left diagnostic mammogram  revealed focal asymmetry in the upper-outer quadrant of the LEFT breast, with possible subtle associated architectural distortion.  11/05/2020, left upper outer quadrant breast mass biopsy showed grade 1 invasive lobular carcinoma, ER 90%, PR 90%, HER2 negative IHC  11/16/2020 bilateral breast MRI revealed suspicious findings in the left breast. Recommended 3 site biopsy in the left breast: 1) 0.5 cm mass medial upper left breast. 2) 1 cm area of focal enhancement posterior lateral lower left breast. 3) Most posterior aspect of enhancement in the previously biopsy tumor area to assess posterior extent of disease.  11/29/2020 patient underwent additional biopsy of the left breast Lateral  mid left breast needle biopsy  revealed invasive lobular carcinoma with DCIS and lobular neoplasia- (atypical lobular hyperplasia) Lower outer left breast needle biopsy revealed a focus suspicious for perineural invasion and lobular neoplasia (atypical lobular hyperplasia).  Upper inner left breast biopsy revealed focal fat necrosis with associated inflammation. Tumor cells  were ER + (85%), PR + (95%), HER2 - (1+). Ki67 was 2%.  Patient's case was discussed on breast tumor board 12/17/2020. Area of concern spans 11-12 centimeters in all sites will need to be resected.  Patient has established care with Dr. Tonna Boehringer and Dr. Ulice Bold. Patient has decided on bilateral oncoplastic breast reduction.  # 01/02/2021, patient underwent bilateral oncoplastic breast reduction Pathology showed DIAGNOSIS:  A. BREAST, LEFT; EXCISION: - BENIGN MAMMARY PARENCHYMA WITH FOCAL LOBULAR NEOPLASIA.  - CLIPS X2 (DUMBBELL AND CYLINDER), RF TAGS X2, AND BIOPSY SITES IDENTIFIED.  - NO EVIDENCE OF RESIDUAL INVASIVE CARCINOMA.  B. BREAST, LEFT LATERAL; EXCISION:  - INVASIVE LOBULAR CARCINOMA. - SINGLE RF TAG IDENTIFIED; NO BIOPSY CLIPS PRESENT. - BACKGROUND LOBULAR NEOPLASIA.  C. BREAST, LEFT INFERIOR/POSTERIOR; EXCISION:  - LOBULAR NEOPLASIA. - CLIPS X2 (DUMBBELL AND X), RF TAG X1, AND BIOPSY SITES IDENTIFIED. - NO EVIDENCE OF RESIDUAL INVASIVE CARCINOMA. - SEE CANCER SUMMARY.  D. LYMPH NODE, LEFT SENTINEL #1; EXCISION:  - TWO LYMPH NODES, NEGATIVE FOR MALIGNANCY (0/2).  E. BREAST, RIGHT; MASTOPEXY:  - BENIGN SKIN AND MAMMARY PARENCHYMA. - NEGATIVE FOR ATYPICAL PROLIFERATIVE BREAST DISEASE.  F. BREAST, LEFT; MASTOPEXY:  - BENIGN SKIN AND SUBCUTANEOUS TISSUE. - NEGATIVE FOR MALIGNANCY.    # Genetic testing negative. #Patient received adjuvant radiation and was started on adjuvant endocrine therapy with Arimidex after her 04/02/2021 visits. 05/16/21 Gastric bypass surgery.   INTERVAL HISTORY Holly Henderson is a 67 y.o. female who has above history reviewed by me today presents for follow up visit for left breast lobular carcinoma Patient has been on Arimidex since August 2022.  She reports feeling well She has no new complaints today.  No breast concerns.    Review of Systems  Constitutional:  Negative for chills and fever.  HENT:   Negative for sore throat.   Eyes:  Negative  for eye problems.  Respiratory:  Negative for cough, shortness of breath and wheezing.   Cardiovascular:  Negative for chest pain and palpitations.  Gastrointestinal:  Negative for abdominal pain, blood in stool, nausea and vomiting.  Endocrine: Negative for hot flashes.  Genitourinary:  Negative for dysuria.   Musculoskeletal:  Positive for arthralgias. Negative for myalgias.  Skin:  Negative for rash.  Neurological:  Negative for dizziness.  Hematological:  Does not bruise/bleed easily.     Past Medical History:  Diagnosis Date   Diabetes mellitus without complication (HCC)    Family history of breast cancer    Family history of colon cancer    Family history of glioblastoma    Hypertension    Malignant neoplasm of upper-outer quadrant of left female breast (HCC) 11/13/2020   Personal history of radiation therapy     Past Surgical History:  Procedure Laterality Date   BREAST BIOPSY Left 11/05/2020   stereo bx, x-clip, positive   BREAST LUMPECTOMY Left 01/02/2021   BREAST REDUCTION SURGERY Bilateral 01/02/2021   Procedure: BILATERAL ONCOPLASTIC BREAST REDUCTION;  Surgeon: Peggye Form, DO;  Location: [facility] ORS;  Service: Plastics;  Laterality: Bilateral;   CHOLECYSTECTOMY     COLONOSCOPY     dental work     FRACTURE SURGERY  left - right arm - left arm   PART MASTECTOMY,RADIO FREQUENCY LOCALIZER,AXILLARY SENTINEL NODE BIOPSY Left 01/02/2021   Procedure: PART MASTECTOMY,RADIO FREQUENCY LOCALIZER,AXILLARY SENTINEL NODE BIOPSY;  Surgeon: Sung Amabile, DO;  Location: [facility];  Service: General;  Laterality: Left;   TOTAL KNEE ARTHROPLASTY Left 01/26/2020   Procedure: TOTAL KNEE ARTHROPLASTY;  Surgeon: Christena Flake, MD;  Location: [facility];  Service: Orthopedics;  Laterality: Left;    Family History  Problem Relation Age of Onset   Breast cancer Mother 27   Cancer - Other Father        glioblastoma x2, d. 52   Lung cancer Maternal Grandmother        hx smoking    AAA (abdominal aortic aneurysm) Maternal Grandfather    Colon cancer Paternal Grandfather        dx 38s, d. 52s   Breast cancer Other     Social History:  reports that she has never smoked. She has never used smokeless tobacco. She reports current alcohol use. She reports that she does not use drugs. She has never smoked. She has not had any alcohol since she was diagnosed with diabetes. She denies exposure to radiation or toxins. She has 6 children. She previously lived in [place]. 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 The patient is alone today.  Allergies:  Allergies  Allergen Reactions   Latex Other (See Comments)    Irritation/gyn  Reaction only with urinary catheters    Current Medications: Current Outpatient Medications  Medication Sig Dispense Refill   Calcium 500 MG tablet Take 500 mg by mouth in the morning, at noon, and at bedtime.     hydrochlorothiazide (HYDRODIURIL) 25 MG tablet Take 1 tablet by mouth daily.     losartan (COZAAR) 50 MG tablet Take 1 tablet by mouth daily.     Multiple Vitamins-Minerals (MULTIVITAMIN WITH MINERALS) tablet Take 1 tablet by mouth daily.     anastrozole (ARIMIDEX) 1 MG tablet Take 1 tablet (1 mg total) by mouth daily. 90 tablet 1   No current facility-administered medications for this visit.     Performance status (ECOG): 1  Vitals Blood pressure 137/70, pulse 62, temperature (!) 96.1 F (35.6 C), temperature source Tympanic, resp. rate 18, weight 181 lb 14.4 oz (82.5 kg), SpO2 100%.   Physical Exam Vitals and nursing note reviewed.  Constitutional:      General: She is not in acute distress.    Appearance: She is not diaphoretic.  Eyes:     General: No scleral icterus.    Conjunctiva/sclera: Conjunctivae normal.  Cardiovascular:     Rate and Rhythm: Normal rate and regular rhythm.     Pulses: Normal pulses.  Pulmonary:     Effort: Pulmonary effort is normal.     Breath sounds: Normal breath sounds.  Abdominal:      General: There is no distension.  Musculoskeletal:        General: No swelling.     Cervical back: Normal range of motion.  Skin:    General: Skin is warm and dry.  Neurological:     General: No focal deficit present.     Mental Status: She is alert and oriented to person, place, and time.  Psychiatric:        Mood and Affect: Mood normal.    Breast exam was performed in seated and lying down position. Patient is status post bilateral oncoplastic reduction, left lumpectomy, well-healed scar. No palpable breast masses bilaterally.  No palpable axillary adenopathy bilaterally.   Laboratory findings    Latest Ref Rng & Units 04/01/2023    1:13 PM 10/01/2022    1:53 PM 03/10/2022    1:36 PM  CBC  WBC 4.0 - 10.5 K/uL 6.5  7.7  5.0   Hemoglobin 12.0 - 15.0 g/dL 16.1  09.6  04.5   Hematocrit 36.0 - 46.0 % 37.1  36.1  38.5   Platelets 150 - 400 K/uL 221  266  198       Latest Ref Rng & Units 04/01/2023    1:13 PM 10/01/2022    1:53 PM 03/10/2022    1:36 PM  CMP  Glucose 70 - 99 mg/dL 409  70  83   BUN 8 - 23 mg/dL 17  18  20    Creatinine 0.44 - 1.00 mg/dL 8.11  9.14  7.82   Sodium 135 - 145 mmol/L 138  141  141   Potassium 3.5 - 5.1 mmol/L 3.6  3.5  3.8   Chloride 98 - 111 mmol/L 106  107  109   CO2 22 - 32 mmol/L 26  26  28    Calcium 8.9 - 10.3 mg/dL 9.5  9.2  9.8   Total Protein 6.5 - 8.1 g/dL 7.3  6.8  7.1   Total Bilirubin 0.3 - 1.2 mg/dL 0.4  0.5  0.5   Alkaline Phos 38 - 126 U/L 80  82  77   AST 15 - 41 U/L 27  21  39   ALT 0 - 44 U/L 22  18  36     Preoperation CA 27-29 is normal 28.6

## 2023-04-01 NOTE — Assessment & Plan Note (Addendum)
#   Invasive lobular left breast cancer, ER/PR +, HER2 - mpT1bN0-  Oncotype DX recurrence score 17, -status post bilateral oncoplastic reduction-adjuvant radiation. Continue Arimidex 1mg  daily.  Mammogram results reviewed and discussed with patient. Obtain MRI breast bilaterally in 6 months.

## 2023-04-20 ENCOUNTER — Ambulatory Visit
Admission: RE | Admit: 2023-04-20 | Discharge: 2023-04-20 | Disposition: A | Discharge status: Home / Self Care | Payer: BC Managed Care – PPO | Source: Ambulatory Visit | Attending: Oncology | Admitting: Oncology

## 2023-04-20 DIAGNOSIS — Z170 Estrogen receptor positive status [ER+]: Secondary | ICD-10-CM | POA: Diagnosis present

## 2023-04-20 DIAGNOSIS — C50812 Malignant neoplasm of overlapping sites of left female breast: Secondary | ICD-10-CM | POA: Diagnosis present

## 2023-04-20 MED ORDER — GADOBUTROL 1 MMOL/ML IV SOLN: 7.0000 mL | Freq: Once | INTRAVENOUS | Status: DC | PRN

## 2023-04-20 MED ORDER — GADOBUTROL 1 MMOL/ML IV SOLN
7.0000 mL | Freq: Once | INTRAVENOUS | Status: AC | PRN
  Administered 2023-04-20: 7 mL via INTRAVENOUS

## 2023-10-01 ENCOUNTER — Inpatient Hospital Stay: Payer: Self-pay | Admitting: Oncology

## 2023-10-01 ENCOUNTER — Inpatient Hospital Stay: Payer: 59 | Attending: Oncology

## 2023-10-01 ENCOUNTER — Encounter: Payer: Self-pay | Admitting: Oncology

## 2023-10-01 VITALS — BP 123/85 | HR 74 | Temp 97.0°F | Resp 19 | Wt 205.7 lb

## 2023-10-01 DIAGNOSIS — Z170 Estrogen receptor positive status [ER+]: Secondary | ICD-10-CM | POA: Insufficient documentation

## 2023-10-01 DIAGNOSIS — Z808 Family history of malignant neoplasm of other organs or systems: Secondary | ICD-10-CM | POA: Diagnosis not present

## 2023-10-01 DIAGNOSIS — Z8249 Family history of ischemic heart disease and other diseases of the circulatory system: Secondary | ICD-10-CM | POA: Insufficient documentation

## 2023-10-01 DIAGNOSIS — Z9049 Acquired absence of other specified parts of digestive tract: Secondary | ICD-10-CM | POA: Insufficient documentation

## 2023-10-01 DIAGNOSIS — M8580 Other specified disorders of bone density and structure, unspecified site: Secondary | ICD-10-CM | POA: Diagnosis not present

## 2023-10-01 DIAGNOSIS — Z800 Family history of malignant neoplasm of digestive organs: Secondary | ICD-10-CM | POA: Diagnosis not present

## 2023-10-01 DIAGNOSIS — Z803 Family history of malignant neoplasm of breast: Secondary | ICD-10-CM | POA: Insufficient documentation

## 2023-10-01 DIAGNOSIS — Z9884 Bariatric surgery status: Secondary | ICD-10-CM | POA: Diagnosis not present

## 2023-10-01 DIAGNOSIS — M2550 Pain in unspecified joint: Secondary | ICD-10-CM | POA: Insufficient documentation

## 2023-10-01 DIAGNOSIS — Z1721 Progesterone receptor positive status: Secondary | ICD-10-CM | POA: Diagnosis not present

## 2023-10-01 DIAGNOSIS — Z79899 Other long term (current) drug therapy: Secondary | ICD-10-CM | POA: Insufficient documentation

## 2023-10-01 DIAGNOSIS — Z801 Family history of malignant neoplasm of trachea, bronchus and lung: Secondary | ICD-10-CM | POA: Diagnosis not present

## 2023-10-01 DIAGNOSIS — C50812 Malignant neoplasm of overlapping sites of left female breast: Secondary | ICD-10-CM | POA: Insufficient documentation

## 2023-10-01 DIAGNOSIS — Z79811 Long term (current) use of aromatase inhibitors: Secondary | ICD-10-CM | POA: Insufficient documentation

## 2023-10-01 LAB — CMP (CANCER CENTER ONLY)
ALT: 19 U/L (ref 0–44)
AST: 24 U/L (ref 15–41)
Albumin: 3.9 g/dL (ref 3.5–5.0)
Alkaline Phosphatase: 69 U/L (ref 38–126)
Anion gap: 8 (ref 5–15)
BUN: 16 mg/dL (ref 8–23)
CO2: 27 mmol/L (ref 22–32)
Calcium: 9.4 mg/dL (ref 8.9–10.3)
Chloride: 105 mmol/L (ref 98–111)
Creatinine: 0.93 mg/dL (ref 0.44–1.00)
GFR, Estimated: >60 mL/min (ref >60)
Glucose, Bld: 159 mg/dL — ABNORMAL HIGH (ref 70–99)
Potassium: 3.7 mmol/L (ref 3.5–5.1)
Sodium: 140 mmol/L (ref 135–145)
Total Bilirubin: 0.8 mg/dL (ref 0.0–1.2)
Total Protein: 6.9 g/dL (ref 6.5–8.1)

## 2023-10-01 LAB — CBC WITH DIFFERENTIAL (CANCER CENTER ONLY)
Abs Immature Granulocytes: 0.02 10*3/uL (ref 0.00–0.07)
Basophils Absolute: 0 10*3/uL (ref 0.0–0.1)
Basophils Relative: 1 %
Eosinophils Absolute: 0.3 10*3/uL (ref 0.0–0.5)
Eosinophils Relative: 5 %
HCT: 37 % (ref 36.0–46.0)
Hemoglobin: 12.5 g/dL (ref 12.0–15.0)
Immature Granulocytes: 0 %
Lymphocytes Relative: 27 %
Lymphs Abs: 1.7 10*3/uL (ref 0.7–4.0)
MCH: 30.9 pg (ref 26.0–34.0)
MCHC: 33.8 g/dL (ref 30.0–36.0)
MCV: 91.4 fL (ref 80.0–100.0)
Monocytes Absolute: 0.5 10*3/uL (ref 0.1–1.0)
Monocytes Relative: 7 %
Neutro Abs: 3.9 10*3/uL (ref 1.7–7.7)
Neutrophils Relative %: 60 %
Platelet Count: 202 10*3/uL (ref 150–400)
RBC: 4.05 MIL/uL (ref 3.87–5.11)
RDW: 13.2 % (ref 11.5–15.5)
WBC Count: 6.4 10*3/uL (ref 4.0–10.5)
nRBC: 0 % (ref 0.0–0.2)

## 2023-10-01 MED ORDER — ANASTROZOLE 1 MG PO TABS: 1.0000 mg | ORAL_TABLET | Freq: Every day | ORAL | 1 refills | Status: DC

## 2023-10-01 NOTE — Patient Instructions (Signed)
 [facility] will contact you directly to schedule your Mammogram. If you don't hear back from them please contact them at [phone].

## 2023-10-01 NOTE — Assessment & Plan Note (Addendum)
#   Invasive lobular left breast cancer, ER/PR +, HER2 - mpT1bN0-  Oncotype DX recurrence score 17, -status post bilateral oncoplastic reduction-adjuvant radiation. Continue Arimidex  1mg  daily.  MRI breasts  results reviewed and discussed with patient. Obtain bilateral  breast mammogram in 6 months.

## 2023-10-01 NOTE — Progress Notes (Signed)
 Hematology/Oncology Progress note [phone] [phone]     Chief Complaint: Holly Henderson is a 68 y.o. female presents for follow-up of left breast lobular carcinoma.  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 Continue Arimidex  1mg  daily.  MRI breasts  results reviewed and discussed with patient. Obtain bilateral  breast mammogram in 6 months.   Osteopenia DEXA scan showed osteopenia. FRAX major osteoporotic fracture risk is 8%. Recommend calcium and vitamin D     History of gastric bypass Normal B12 and folate    Orders Placed This Encounter  Procedures   MM 3D DIAGNOSTIC MAMMOGRAM BILATERAL BREAST    Standing Status:   Future    Expected Date:   10/22/2023    Expiration Date:   09/30/2024    Reason for Exam (SYMPTOM  OR DIAGNOSIS REQUIRED):   hx breast cancer    Preferred imaging location?:   [facility]   US  LIMITED ULTRASOUND INCLUDING AXILLA LEFT BREAST     Standing Status:   Future    Expected Date:   10/22/2023    Expiration Date:   09/30/2024    Reason for Exam (SYMPTOM  OR DIAGNOSIS REQUIRED):   hx breast cancer    Preferred imaging location?:   [facility]   US  LIMITED ULTRASOUND INCLUDING AXILLA RIGHT BREAST    Standing Status:   Future    Expected Date:   10/22/2023    Expiration Date:   09/30/2024    Reason for Exam (SYMPTOM  OR DIAGNOSIS REQUIRED):   hx breast cancer    Preferred imaging  location?:   [facility]   CBC with Differential (Cancer Center Only)    Standing Status:   Future    Expected Date:   03/30/2024    Expiration Date:   09/30/2024   CMP (Cancer Center only)    Standing Status:   Future    Expected Date:   03/30/2024    Expiration Date:   09/30/2024   Follow up in 6 months.  All questions were answered. The patient knows to call the clinic with any problems, questions or concerns.  Call Babara, MD, PhD [facility] Hematology Oncology 10/01/2023    PERTINENT ONCOLOGY HISTORY Holly Henderson is a 68 y.o.afemale who has above oncology history reviewed by me today presented for follow up visit for management of left breast lobular carcinoma  Patient previously followed up by Dr.Corcoran, patient switched care to me on 12/22/20 Extensive medical records review was performed by me 10/11/2020 Bilateral screening mammogram on  revealed asymmetry with distortion in the left breast.   10/29/2020 Left diagnostic mammogram  revealed focal asymmetry in the upper-outer quadrant of the LEFT breast, with possible subtle associated architectural distortion.  11/05/2020, left upper outer quadrant breast mass biopsy showed grade 1 invasive lobular carcinoma, ER 90%, PR 90%, HER2 negative IHC  11/16/2020 bilateral breast MRI revealed suspicious findings in the left breast. Recommended 3 site biopsy in the left breast: 1) 0.5 cm mass  medial upper left breast. 2) 1 cm area of focal enhancement posterior lateral lower left breast. 3) Most posterior aspect of enhancement in the previously biopsy tumor area to assess posterior extent of disease.  11/29/2020 patient underwent additional biopsy of the left breast Lateral mid left breast needle biopsy  revealed invasive lobular carcinoma with DCIS and lobular neoplasia- (atypical lobular hyperplasia) Lower outer left breast needle biopsy revealed a focus suspicious for perineural invasion and lobular neoplasia (atypical lobular  hyperplasia).  Upper inner left breast biopsy revealed focal fat necrosis with associated inflammation. Tumor cells were ER + (85%), PR + (95%), HER2 - (1+). Ki67 was 2%.  Patient's case was discussed on breast tumor board 12/17/2020. Area of concern spans 11-12 centimeters in all sites will need to be resected.  Patient has established care with Dr. Tye and Dr. Lowery. Patient has decided on bilateral oncoplastic breast reduction.  # 01/02/2021, patient underwent bilateral oncoplastic breast reduction Pathology showed DIAGNOSIS:  A. BREAST, LEFT; EXCISION: - BENIGN MAMMARY PARENCHYMA WITH FOCAL LOBULAR NEOPLASIA.  - CLIPS X2 (DUMBBELL AND CYLINDER), RF TAGS X2, AND BIOPSY SITES IDENTIFIED.  - NO EVIDENCE OF RESIDUAL INVASIVE CARCINOMA.  B. BREAST, LEFT LATERAL; EXCISION:  - INVASIVE LOBULAR CARCINOMA. - SINGLE RF TAG IDENTIFIED; NO BIOPSY CLIPS PRESENT. - BACKGROUND LOBULAR NEOPLASIA.  C. BREAST, LEFT INFERIOR/POSTERIOR; EXCISION:  - LOBULAR NEOPLASIA. - CLIPS X2 (DUMBBELL AND X), RF TAG X1, AND BIOPSY SITES IDENTIFIED. - NO EVIDENCE OF RESIDUAL INVASIVE CARCINOMA. - SEE CANCER SUMMARY.  D. LYMPH NODE, LEFT SENTINEL #1; EXCISION:  - TWO LYMPH NODES, NEGATIVE FOR MALIGNANCY (0/2).  E. BREAST, RIGHT; MASTOPEXY:  - BENIGN SKIN AND MAMMARY PARENCHYMA. - NEGATIVE FOR ATYPICAL PROLIFERATIVE BREAST DISEASE.  F. BREAST, LEFT; MASTOPEXY:  - BENIGN SKIN AND SUBCUTANEOUS TISSUE. - NEGATIVE FOR MALIGNANCY.    # Genetic testing negative. #Patient received adjuvant radiation and was started on adjuvant endocrine therapy with Arimidex  after her 04/02/2021 visits. 05/16/21 Gastric bypass surgery.   INTERVAL HISTORY Holly Henderson is a 68 y.o. female who has above history reviewed by me today presents for follow up visit for left breast lobular carcinoma Patient has been on Arimidex  since August 2022.  She reports feeling well She has no new complaints today.  No breast concerns.    Review  of Systems  Constitutional:  Negative for chills and fever.  HENT:   Negative for sore throat.   Eyes:  Negative for eye problems.  Respiratory:  Negative for cough, shortness of breath and wheezing.   Cardiovascular:  Negative for chest pain and palpitations.  Gastrointestinal:  Negative for abdominal pain, blood in stool, nausea and vomiting.  Endocrine: Negative for hot flashes.  Genitourinary:  Negative for dysuria.   Musculoskeletal:  Positive for arthralgias. Negative for myalgias.  Skin:  Negative for rash.  Neurological:  Negative for dizziness.  Hematological:  Does not bruise/bleed easily.     Past Medical History:  Diagnosis Date   Diabetes mellitus without complication (HCC)    Family history of breast cancer    Family history of colon cancer    Family history of glioblastoma    Hypertension    Malignant neoplasm of upper-outer quadrant of left female breast (HCC) 11/13/2020   Personal history of radiation therapy     Past Surgical History:  Procedure Laterality Date   BREAST BIOPSY Left 11/05/2020   stereo bx, x-clip, positive   BREAST LUMPECTOMY Left 01/02/2021   BREAST REDUCTION SURGERY Bilateral 01/02/2021  Procedure: BILATERAL ONCOPLASTIC BREAST REDUCTION;  Surgeon: Lowery Estefana RAMAN, DO;  Location: [facility];  Service: Plastics;  Laterality: Bilateral;   CHOLECYSTECTOMY     COLONOSCOPY     dental work     FRACTURE SURGERY     left - right arm - left arm   PART MASTECTOMY,RADIO FREQUENCY LOCALIZER,AXILLARY SENTINEL NODE BIOPSY Left 01/02/2021   Procedure: PART MASTECTOMY,RADIO FREQUENCY LOCALIZER,AXILLARY SENTINEL NODE BIOPSY;  Surgeon: Tye Millet, DO;  Location: [facility];  Service: General;  Laterality: Left;   TOTAL KNEE ARTHROPLASTY Left 01/26/2020   Procedure: TOTAL KNEE ARTHROPLASTY;  Surgeon: Edie Norleen PARAS, MD;  Location: [facility];  Service: Orthopedics;  Laterality: Left;    Family History  Problem Relation Age of Onset   Breast cancer  Mother 59   Cancer - Other Father        glioblastoma x2, d. 46   Lung cancer Maternal Grandmother        hx smoking   AAA (abdominal aortic aneurysm) Maternal Grandfather    Colon cancer Paternal Grandfather        dx 39s, d. 43s   Breast cancer Other     Social History:  reports that she has never smoked. She has never used smokeless tobacco. She reports current alcohol use. She reports that she does not use drugs. She has never smoked. She has not had any alcohol since she was diagnosed with diabetes. She denies exposure to radiation or toxins. She has 6 children. She previously lived in [place] . 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  Allergies:  Allergies  Allergen Reactions   Poison Oak Extract Hives   Latex Other (See Comments)    Irritation/gyn  Reaction only with urinary catheters    Current Medications: Current Outpatient Medications  Medication Sig Dispense Refill   ergocalciferol (VITAMIN D2) 1.25 MG (50000 UT) capsule Take 50,000 Units by mouth daily.     anastrozole  (ARIMIDEX ) 1 MG tablet Take 1 tablet (1 mg total) by mouth daily. 90 tablet 1   Calcium 500 MG tablet Take 500 mg by mouth in the morning, at noon, and at bedtime.     hydrochlorothiazide (HYDRODIURIL) 25 MG tablet Take 1 tablet by mouth daily.     losartan  (COZAAR ) 50 MG tablet Take 1 tablet by mouth daily.     Multiple Vitamins-Minerals (MULTIVITAMIN WITH MINERALS) tablet Take 1 tablet by mouth daily.     No current facility-administered medications for this visit.     Performance status (ECOG): 1  Vitals Blood pressure 123/85, pulse 74, temperature (!) 97 F (36.1 C), temperature source Tympanic, resp. rate 19, weight 205 lb 11.2 oz (93.3 kg), SpO2 100%.   Physical Exam Vitals and nursing note reviewed.  Constitutional:      General: She is not in acute distress.    Appearance: She is not diaphoretic.  Eyes:     General: No scleral icterus.     Conjunctiva/sclera: Conjunctivae normal.  Cardiovascular:     Rate and Rhythm: Normal rate and regular rhythm.     Pulses: Normal pulses.  Pulmonary:     Effort: Pulmonary effort is normal.     Breath sounds: Normal breath sounds.  Abdominal:     General: There is no distension.  Musculoskeletal:        General: No swelling.     Cervical back: Normal range of motion.  Skin:    General: Skin is warm and dry.  Neurological:     General: No focal deficit present.     Mental Status: She is alert and oriented to person, place, and time.  Psychiatric:        Mood and Affect: Mood normal.      Laboratory findings    Latest Ref Rng & Units 10/01/2023    2:21 PM 04/01/2023    1:13 PM 10/01/2022    1:53 PM  CBC  WBC 4.0 - 10.5 K/uL 6.4  6.5  7.7   Hemoglobin 12.0 - 15.0 g/dL 87.4  87.8  88.3   Hematocrit 36.0 - 46.0 % 37.0  37.1  36.1   Platelets 150 - 400 K/uL 202  221  266       Latest Ref Rng & Units 10/01/2023    2:21 PM 04/01/2023    1:13 PM 10/01/2022    1:53 PM  CMP  Glucose 70 - 99 mg/dL 840  865  70   BUN 8 - 23 mg/dL 16  17  18    Creatinine 0.44 - 1.00 mg/dL 9.06  9.05  9.29   Sodium 135 - 145 mmol/L 140  138  141   Potassium 3.5 - 5.1 mmol/L 3.7  3.6  3.5   Chloride 98 - 111 mmol/L 105  106  107   CO2 22 - 32 mmol/L 27  26  26    Calcium 8.9 - 10.3 mg/dL 9.4  9.5  9.2   Total Protein 6.5 - 8.1 g/dL 6.9  7.3  6.8   Total Bilirubin 0.0 - 1.2 mg/dL 0.8  0.4  0.5   Alkaline Phos 38 - 126 U/L 69  80  82   AST 15 - 41 U/L 24  27  21    ALT 0 - 44 U/L 19  22  18      Preoperation CA 27-29 is normal 28.6

## 2023-10-01 NOTE — Assessment & Plan Note (Signed)
DEXA scan showed osteopenia. FRAX major osteoporotic fracture risk is 8%. Recommend calcium and vitamin D

## 2023-10-01 NOTE — Assessment & Plan Note (Signed)
Normal B12 and folate

## 2023-11-02 ENCOUNTER — Ambulatory Visit
Admission: RE | Admit: 2023-11-02 | Discharge: 2023-11-02 | Disposition: A | Discharge status: Home / Self Care | Payer: 59 | Source: Ambulatory Visit | Attending: Oncology | Admitting: Oncology

## 2023-11-02 DIAGNOSIS — Z170 Estrogen receptor positive status [ER+]: Secondary | ICD-10-CM | POA: Insufficient documentation

## 2023-11-02 DIAGNOSIS — C50812 Malignant neoplasm of overlapping sites of left female breast: Secondary | ICD-10-CM | POA: Insufficient documentation

## 2024-03-31 ENCOUNTER — Other Ambulatory Visit: Payer: 59

## 2024-03-31 ENCOUNTER — Ambulatory Visit: Payer: 59 | Admitting: Oncology

## 2024-04-05 ENCOUNTER — Inpatient Hospital Stay: Attending: Oncology

## 2024-04-05 ENCOUNTER — Encounter: Payer: Self-pay | Admitting: Oncology

## 2024-04-05 ENCOUNTER — Inpatient Hospital Stay: Admitting: Oncology

## 2024-04-05 VITALS — BP 143/64 | HR 66 | Temp 97.2°F | Resp 18 | Wt 212.0 lb

## 2024-04-05 DIAGNOSIS — Z800 Family history of malignant neoplasm of digestive organs: Secondary | ICD-10-CM | POA: Diagnosis not present

## 2024-04-05 DIAGNOSIS — M8580 Other specified disorders of bone density and structure, unspecified site: Secondary | ICD-10-CM

## 2024-04-05 DIAGNOSIS — Z170 Estrogen receptor positive status [ER+]: Secondary | ICD-10-CM | POA: Diagnosis not present

## 2024-04-05 DIAGNOSIS — Z8249 Family history of ischemic heart disease and other diseases of the circulatory system: Secondary | ICD-10-CM | POA: Insufficient documentation

## 2024-04-05 DIAGNOSIS — Z803 Family history of malignant neoplasm of breast: Secondary | ICD-10-CM | POA: Diagnosis not present

## 2024-04-05 DIAGNOSIS — C50812 Malignant neoplasm of overlapping sites of left female breast: Secondary | ICD-10-CM | POA: Insufficient documentation

## 2024-04-05 DIAGNOSIS — Z808 Family history of malignant neoplasm of other organs or systems: Secondary | ICD-10-CM | POA: Diagnosis not present

## 2024-04-05 DIAGNOSIS — Z79811 Long term (current) use of aromatase inhibitors: Secondary | ICD-10-CM | POA: Insufficient documentation

## 2024-04-05 DIAGNOSIS — Z801 Family history of malignant neoplasm of trachea, bronchus and lung: Secondary | ICD-10-CM | POA: Insufficient documentation

## 2024-04-05 DIAGNOSIS — Z79899 Other long term (current) drug therapy: Secondary | ICD-10-CM | POA: Insufficient documentation

## 2024-04-05 DIAGNOSIS — R232 Flushing: Secondary | ICD-10-CM | POA: Diagnosis not present

## 2024-04-05 DIAGNOSIS — Z1721 Progesterone receptor positive status: Secondary | ICD-10-CM | POA: Insufficient documentation

## 2024-04-05 DIAGNOSIS — Z9884 Bariatric surgery status: Secondary | ICD-10-CM | POA: Diagnosis not present

## 2024-04-05 DIAGNOSIS — Z96652 Presence of left artificial knee joint: Secondary | ICD-10-CM | POA: Insufficient documentation

## 2024-04-05 DIAGNOSIS — Z17411 Hormone receptor positive with human epidermal growth factor receptor 2 negative status: Secondary | ICD-10-CM | POA: Insufficient documentation

## 2024-04-05 DIAGNOSIS — Z9049 Acquired absence of other specified parts of digestive tract: Secondary | ICD-10-CM | POA: Diagnosis not present

## 2024-04-05 LAB — CBC WITH DIFFERENTIAL (CANCER CENTER ONLY)
Abs Immature Granulocytes: 0.03 K/uL (ref 0.00–0.07)
Basophils Absolute: 0.1 K/uL (ref 0.0–0.1)
Basophils Relative: 1 %
Eosinophils Absolute: 0.3 K/uL (ref 0.0–0.5)
Eosinophils Relative: 4 %
HCT: 38.9 % (ref 36.0–46.0)
Hemoglobin: 12.9 g/dL (ref 12.0–15.0)
Immature Granulocytes: 0 %
Lymphocytes Relative: 17 %
Lymphs Abs: 1.2 K/uL (ref 0.7–4.0)
MCH: 30.3 pg (ref 26.0–34.0)
MCHC: 33.2 g/dL (ref 30.0–36.0)
MCV: 91.3 fL (ref 80.0–100.0)
Monocytes Absolute: 0.8 K/uL (ref 0.1–1.0)
Monocytes Relative: 11 %
Neutro Abs: 4.8 K/uL (ref 1.7–7.7)
Neutrophils Relative %: 67 %
Platelet Count: 220 K/uL (ref 150–400)
RBC: 4.26 MIL/uL (ref 3.87–5.11)
RDW: 13.5 % (ref 11.5–15.5)
WBC Count: 7.1 K/uL (ref 4.0–10.5)
nRBC: 0 % (ref 0.0–0.2)

## 2024-04-05 LAB — CMP (CANCER CENTER ONLY)
ALT: 20 U/L (ref 0–44)
AST: 24 U/L (ref 15–41)
Albumin: 3.8 g/dL (ref 3.5–5.0)
Alkaline Phosphatase: 90 U/L (ref 38–126)
Anion gap: 8 (ref 5–15)
BUN: 15 mg/dL (ref 8–23)
CO2: 25 mmol/L (ref 22–32)
Calcium: 9.7 mg/dL (ref 8.9–10.3)
Chloride: 106 mmol/L (ref 98–111)
Creatinine: 0.79 mg/dL (ref 0.44–1.00)
GFR, Estimated: >60 mL/min (ref >60)
Glucose, Bld: 102 mg/dL — ABNORMAL HIGH (ref 70–99)
Potassium: 3.8 mmol/L (ref 3.5–5.1)
Sodium: 139 mmol/L (ref 135–145)
Total Bilirubin: 0.8 mg/dL (ref 0.0–1.2)
Total Protein: 7 g/dL (ref 6.5–8.1)

## 2024-04-05 MED ORDER — ANASTROZOLE 1 MG PO TABS: 1.0000 mg | ORAL_TABLET | Freq: Every day | ORAL | 1 refills | Status: AC

## 2024-04-05 NOTE — Assessment & Plan Note (Addendum)
 Will check B12 and folate periodically

## 2024-04-05 NOTE — Assessment & Plan Note (Signed)
DEXA scan showed osteopenia. FRAX major osteoporotic fracture risk is 8%. Recommend calcium and vitamin D

## 2024-04-05 NOTE — Progress Notes (Signed)
 Hematology/Oncology Progress note [phone] [phone]     Chief Complaint: Holly Henderson is a 68 y.o. female presents for follow-up of left breast lobular carcinoma.  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 Continue Arimidex  1mg  daily.  Mammogram results reviewed and discussed with patient. Obtain bilateral breast MRI in 6 months.   History of gastric bypass Will check B12 and folate periodically  Osteopenia DEXA scan showed osteopenia. FRAX major osteoporotic fracture risk is 8%. Recommend calcium and vitamin D       Orders Placed This Encounter  Procedures   MR BREAST BILATERAL W WO CONTRAST INC CAD    Standing Status:   Future    Expected Date:   05/10/2024    Expiration Date:   04/05/2025    If indicated for the ordered procedure, I authorize the administration of contrast media per Radiology protocol:   Yes    What is the patient's sedation requirement?:   No Sedation    Does the patient have a pacemaker or implanted devices?:   No    Radiology Contrast Protocol - do NOT remove file path:   [email or web address]    Preferred imaging location?:   [facility] (table limit - 500lbs)   CMP (Cancer Center only)    Standing Status:   Future    Expected Date:   10/06/2024    Expiration Date:   01/04/2025   CBC with Differential (Cancer Center Only)    Standing Status:    Future    Expected Date:   10/06/2024    Expiration Date:   01/04/2025   Vitamin B12    Standing Status:   Future    Expected Date:   10/06/2024    Expiration Date:   01/04/2025   Folate    Standing Status:   Future    Expected Date:   10/06/2024    Expiration Date:   01/04/2025   Follow up in 6 months.  All questions were answered. The patient knows to call the clinic with any problems, questions or concerns.  Call Babara, MD, PhD [facility] Hematology Oncology 04/05/2024    PERTINENT ONCOLOGY HISTORY Holly Henderson is a 68 y.o.afemale who has above oncology history reviewed by me today presented for follow up visit for management of left breast lobular carcinoma  Patient previously followed up by Dr.Corcoran, patient switched care to me on 12/22/20 Extensive medical records review was performed by me 10/11/2020 Bilateral screening mammogram on  revealed asymmetry with distortion in the left breast.   10/29/2020 Left diagnostic mammogram  revealed focal asymmetry in the upper-outer quadrant of the LEFT breast, with possible subtle associated architectural distortion.  11/05/2020, left upper outer quadrant breast mass biopsy showed grade 1 invasive lobular carcinoma, ER 90%, PR 90%, HER2 negative IHC  11/16/2020 bilateral breast MRI revealed suspicious findings in the left breast. Recommended 3 site biopsy in the left breast: 1) 0.5 cm mass medial upper left breast. 2) 1 cm area of focal  enhancement posterior lateral lower left breast. 3) Most posterior aspect of enhancement in the previously biopsy tumor area to assess posterior extent of disease.  11/29/2020 patient underwent additional biopsy of the left breast Lateral mid left breast needle biopsy  revealed invasive lobular carcinoma with DCIS and lobular neoplasia- (atypical lobular hyperplasia) Lower outer left breast needle biopsy revealed a focus suspicious for perineural invasion and lobular neoplasia (atypical lobular  hyperplasia).  Upper inner left breast biopsy revealed focal fat necrosis with associated inflammation. Tumor cells were ER + (85%), PR + (95%), HER2 - (1+). Ki67 was 2%.  Patient's case was discussed on breast tumor board 12/17/2020. Area of concern spans 11-12 centimeters in all sites will need to be resected.  Patient has established care with Dr. Tye and Dr. Lowery. Patient has decided on bilateral oncoplastic breast reduction.  # 01/02/2021, patient underwent bilateral oncoplastic breast reduction Pathology showed DIAGNOSIS:  A. BREAST, LEFT; EXCISION: - BENIGN MAMMARY PARENCHYMA WITH FOCAL LOBULAR NEOPLASIA.  - CLIPS X2 (DUMBBELL AND CYLINDER), RF TAGS X2, AND BIOPSY SITES IDENTIFIED.  - NO EVIDENCE OF RESIDUAL INVASIVE CARCINOMA.  B. BREAST, LEFT LATERAL; EXCISION:  - INVASIVE LOBULAR CARCINOMA. - SINGLE RF TAG IDENTIFIED; NO BIOPSY CLIPS PRESENT. - BACKGROUND LOBULAR NEOPLASIA.  C. BREAST, LEFT INFERIOR/POSTERIOR; EXCISION:  - LOBULAR NEOPLASIA. - CLIPS X2 (DUMBBELL AND X), RF TAG X1, AND BIOPSY SITES IDENTIFIED. - NO EVIDENCE OF RESIDUAL INVASIVE CARCINOMA. - SEE CANCER SUMMARY.  D. LYMPH NODE, LEFT SENTINEL #1; EXCISION:  - TWO LYMPH NODES, NEGATIVE FOR MALIGNANCY (0/2).  E. BREAST, RIGHT; MASTOPEXY:  - BENIGN SKIN AND MAMMARY PARENCHYMA. - NEGATIVE FOR ATYPICAL PROLIFERATIVE BREAST DISEASE.  F. BREAST, LEFT; MASTOPEXY:  - BENIGN SKIN AND SUBCUTANEOUS TISSUE. - NEGATIVE FOR MALIGNANCY.    # Genetic testing negative. #Patient received adjuvant radiation and was started on adjuvant endocrine therapy with Arimidex  after her 04/02/2021 visits. 05/16/21 Gastric bypass surgery.   INTERVAL HISTORY Holly Henderson is a 68 y.o. female who has above history reviewed by me today presents for follow up visit for left breast lobular carcinoma Patient has been on Arimidex  since August 2022.  She reports feeling well She has no new complaints today.  No breast concerns. Some hot  flash, no joint pain.    Review of Systems  Constitutional:  Negative for chills and fever.  HENT:   Negative for sore throat.   Eyes:  Negative for eye problems.  Respiratory:  Negative for cough, shortness of breath and wheezing.   Cardiovascular:  Negative for chest pain and palpitations.  Gastrointestinal:  Negative for abdominal pain, blood in stool, nausea and vomiting.  Endocrine: Positive for hot flashes.  Genitourinary:  Negative for dysuria.   Musculoskeletal:  Negative for arthralgias and myalgias.  Skin:  Negative for rash.  Neurological:  Negative for dizziness.  Hematological:  Does not bruise/bleed easily.     Past Medical History:  Diagnosis Date   Diabetes mellitus without complication (HCC)    Family history of breast cancer    Family history of colon cancer    Family history of glioblastoma    Hypertension    Malignant neoplasm of upper-outer quadrant of left female breast (HCC) 11/13/2020   Personal history of radiation therapy     Past Surgical History:  Procedure Laterality Date   BREAST BIOPSY Left 11/05/2020   stereo bx, x-clip, positive   BREAST LUMPECTOMY Left 01/02/2021   BREAST REDUCTION SURGERY Bilateral 01/02/2021   Procedure: BILATERAL ONCOPLASTIC BREAST  REDUCTION;  Surgeon: Lowery Estefana RAMAN, DO;  Location: [facility];  Service: Plastics;  Laterality: Bilateral;   CHOLECYSTECTOMY     COLONOSCOPY     dental work     FRACTURE SURGERY     left - right arm - left arm   PART MASTECTOMY,RADIO FREQUENCY LOCALIZER,AXILLARY SENTINEL NODE BIOPSY Left 01/02/2021   Procedure: PART MASTECTOMY,RADIO FREQUENCY LOCALIZER,AXILLARY SENTINEL NODE BIOPSY;  Surgeon: Tye Millet, DO;  Location: [facility];  Service: General;  Laterality: Left;   TOTAL KNEE ARTHROPLASTY Left 01/26/2020   Procedure: TOTAL KNEE ARTHROPLASTY;  Surgeon: Edie Norleen PARAS, MD;  Location: [facility];  Service: Orthopedics;  Laterality: Left;    Family History  Problem Relation Age of  Onset   Breast cancer Mother 46   Cancer - Other Father        glioblastoma x2, d. 73   Lung cancer Maternal Grandmother        hx smoking   AAA (abdominal aortic aneurysm) Maternal Grandfather    Colon cancer Paternal Grandfather        dx 63s, d. 69s   Breast cancer Other     Social History:  reports that she has never smoked. She has never used smokeless tobacco. She reports current alcohol use. She reports that she does not use drugs. She has never smoked. She has not had any alcohol since she was diagnosed with diabetes. She denies exposure to radiation or toxins. She has 6 children. She previously lived in [place] . 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 The patient is alone today.  Allergies:  Allergies  Allergen Reactions   Poison Oak Extract Hives   Latex Other (See Comments)    Irritation/gyn  Reaction only with urinary catheters    Current Medications: Current Outpatient Medications  Medication Sig Dispense Refill   Calcium 500 MG tablet Take 500 mg by mouth in the morning, at noon, and at bedtime.     ergocalciferol (VITAMIN D2) 1.25 MG (50000 UT) capsule Take 50,000 Units by mouth daily.     hydrochlorothiazide (HYDRODIURIL) 25 MG tablet Take 1 tablet by mouth daily.     losartan  (COZAAR ) 50 MG tablet Take 1 tablet by mouth daily.     Multiple Vitamins-Minerals (MULTIVITAMIN WITH MINERALS) tablet Take 1 tablet by mouth daily.     anastrozole  (ARIMIDEX ) 1 MG tablet Take 1 tablet (1 mg total) by mouth daily. 90 tablet 1   No current facility-administered medications for this visit.     Performance status (ECOG): 1  Vitals Blood pressure (!) 143/64, pulse 66, temperature (!) 97.2 F (36.2 C), resp. rate 18, weight 212 lb (96.2 kg), SpO2 100%.   Physical Exam Vitals and nursing note reviewed.  Constitutional:      General: She is not in acute distress.    Appearance: She is not diaphoretic.  Eyes:     General: No scleral icterus.    Conjunctiva/sclera:  Conjunctivae normal.  Cardiovascular:     Rate and Rhythm: Normal rate and regular rhythm.     Pulses: Normal pulses.  Pulmonary:     Effort: Pulmonary effort is normal.     Breath sounds: Normal breath sounds.  Abdominal:     General: There is no distension.  Musculoskeletal:        General: No swelling.     Cervical back: Normal range of motion.  Skin:    General: Skin is warm and dry.  Neurological:     General: No focal  deficit present.     Mental Status: She is alert and oriented to person, place, and time.  Psychiatric:        Mood and Affect: Mood normal.      Laboratory findings    Latest Ref Rng & Units 04/05/2024    1:04 PM 10/01/2023    2:21 PM 04/01/2023    1:13 PM  CBC  WBC 4.0 - 10.5 K/uL 7.1  6.4  6.5   Hemoglobin 12.0 - 15.0 g/dL 87.0  87.4  87.8   Hematocrit 36.0 - 46.0 % 38.9  37.0  37.1   Platelets 150 - 400 K/uL 220  202  221       Latest Ref Rng & Units 04/05/2024    1:04 PM 10/01/2023    2:21 PM 04/01/2023    1:13 PM  CMP  Glucose 70 - 99 mg/dL 897  840  865   BUN 8 - 23 mg/dL 15  16  17    Creatinine 0.44 - 1.00 mg/dL 9.20  9.06  9.05   Sodium 135 - 145 mmol/L 139  140  138   Potassium 3.5 - 5.1 mmol/L 3.8  3.7  3.6   Chloride 98 - 111 mmol/L 106  105  106   CO2 22 - 32 mmol/L 25  27  26    Calcium 8.9 - 10.3 mg/dL 9.7  9.4  9.5   Total Protein 6.5 - 8.1 g/dL 7.0  6.9  7.3   Total Bilirubin 0.0 - 1.2 mg/dL 0.8  0.8  0.4   Alkaline Phos 38 - 126 U/L 90  69  80   AST 15 - 41 U/L 24  24  27    ALT 0 - 44 U/L 20  19  22      Preoperation CA 27-29 is normal 28.6

## 2024-04-05 NOTE — Assessment & Plan Note (Addendum)
#   Invasive lobular left breast cancer, ER/PR +, HER2 - mpT1bN0-  Oncotype DX recurrence score 17, -status post bilateral oncoplastic reduction-adjuvant radiation. Continue Arimidex  1mg  daily.  Mammogram results reviewed and discussed with patient. Obtain bilateral breast MRI in 6 months.

## 2024-05-10 ENCOUNTER — Ambulatory Visit
Admission: RE | Admit: 2024-05-10 | Discharge: 2024-05-10 | Disposition: A | Discharge status: Home / Self Care | Source: Ambulatory Visit | Attending: Oncology | Admitting: Oncology

## 2024-05-10 DIAGNOSIS — C50812 Malignant neoplasm of overlapping sites of left female breast: Secondary | ICD-10-CM | POA: Insufficient documentation

## 2024-05-10 DIAGNOSIS — Z170 Estrogen receptor positive status [ER+]: Secondary | ICD-10-CM | POA: Diagnosis present

## 2024-05-10 MED ORDER — GADOBUTROL 1 MMOL/ML IV SOLN
9.0000 mL | Freq: Once | INTRAVENOUS | Status: AC | PRN
  Administered 2024-05-10: 9 mL via INTRAVENOUS

## 2024-10-10 ENCOUNTER — Ambulatory Visit: Admitting: Oncology

## 2024-10-10 ENCOUNTER — Other Ambulatory Visit
